# Patient Record
Sex: Male | Born: 1935 | Race: White | Hispanic: No | Marital: Married | Smoking: Former smoker
Health system: Southern US, Community
[De-identification: ages and names within clinical notes are randomized; demographics above are authoritative.]

## PROBLEM LIST (undated history)

## (undated) DIAGNOSIS — C801 Malignant (primary) neoplasm, unspecified: Secondary | ICD-10-CM

## (undated) DIAGNOSIS — R55 Syncope and collapse: Secondary | ICD-10-CM

## (undated) DIAGNOSIS — I350 Nonrheumatic aortic (valve) stenosis: Secondary | ICD-10-CM

## (undated) DIAGNOSIS — I429 Cardiomyopathy, unspecified: Secondary | ICD-10-CM

## (undated) HISTORY — PX: LUNG REMOVAL, PARTIAL: SHX233

## (undated) HISTORY — PX: HEMORROIDECTOMY: SUR656

---

## 2005-11-12 ENCOUNTER — Ambulatory Visit (HOSPITAL_COMMUNITY): Admission: RE | Admit: 2005-11-12 | Discharge: 2005-11-12 | Payer: Self-pay | Admitting: Specialist

## 2005-12-10 ENCOUNTER — Inpatient Hospital Stay (HOSPITAL_COMMUNITY): Admission: RE | Admit: 2005-12-10 | Discharge: 2005-12-15 | Payer: Self-pay | Admitting: Thoracic Surgery

## 2005-12-10 ENCOUNTER — Encounter (INDEPENDENT_AMBULATORY_CARE_PROVIDER_SITE_OTHER): Payer: Self-pay | Admitting: Specialist

## 2005-12-19 ENCOUNTER — Encounter: Admission: RE | Admit: 2005-12-19 | Discharge: 2005-12-19 | Payer: Self-pay | Admitting: Thoracic Surgery

## 2005-12-26 ENCOUNTER — Encounter: Admission: RE | Admit: 2005-12-26 | Discharge: 2005-12-26 | Payer: Self-pay | Admitting: Thoracic Surgery

## 2006-01-09 ENCOUNTER — Encounter: Admission: RE | Admit: 2006-01-09 | Discharge: 2006-01-09 | Payer: Self-pay | Admitting: Thoracic Surgery

## 2006-01-10 ENCOUNTER — Ambulatory Visit: Payer: Self-pay | Admitting: Oncology

## 2006-02-06 ENCOUNTER — Encounter: Admission: RE | Admit: 2006-02-06 | Discharge: 2006-02-06 | Payer: Self-pay | Admitting: Thoracic Surgery

## 2006-04-25 ENCOUNTER — Ambulatory Visit: Payer: Self-pay | Admitting: Oncology

## 2006-06-05 ENCOUNTER — Encounter: Admission: RE | Admit: 2006-06-05 | Discharge: 2006-06-05 | Payer: Self-pay | Admitting: Thoracic Surgery

## 2006-08-22 ENCOUNTER — Ambulatory Visit: Payer: Self-pay | Admitting: Oncology

## 2006-10-23 ENCOUNTER — Encounter: Admission: RE | Admit: 2006-10-23 | Discharge: 2006-10-23 | Payer: Self-pay | Admitting: Thoracic Surgery

## 2006-10-23 ENCOUNTER — Ambulatory Visit: Payer: Self-pay | Admitting: Thoracic Surgery

## 2006-12-13 ENCOUNTER — Ambulatory Visit: Payer: Self-pay | Admitting: Oncology

## 2007-01-06 IMAGING — CR DG CHEST 1V PORT
1 series · 1 of 1 positions shown · non-contrast
Comparison: Earlier the same date.

CLINICAL DATA: Left chest tube removal. 
 PORTABLE CHEST ? 1 VIEW ? 12/14/05 AT 6756 HOURS:

[view not recorded]
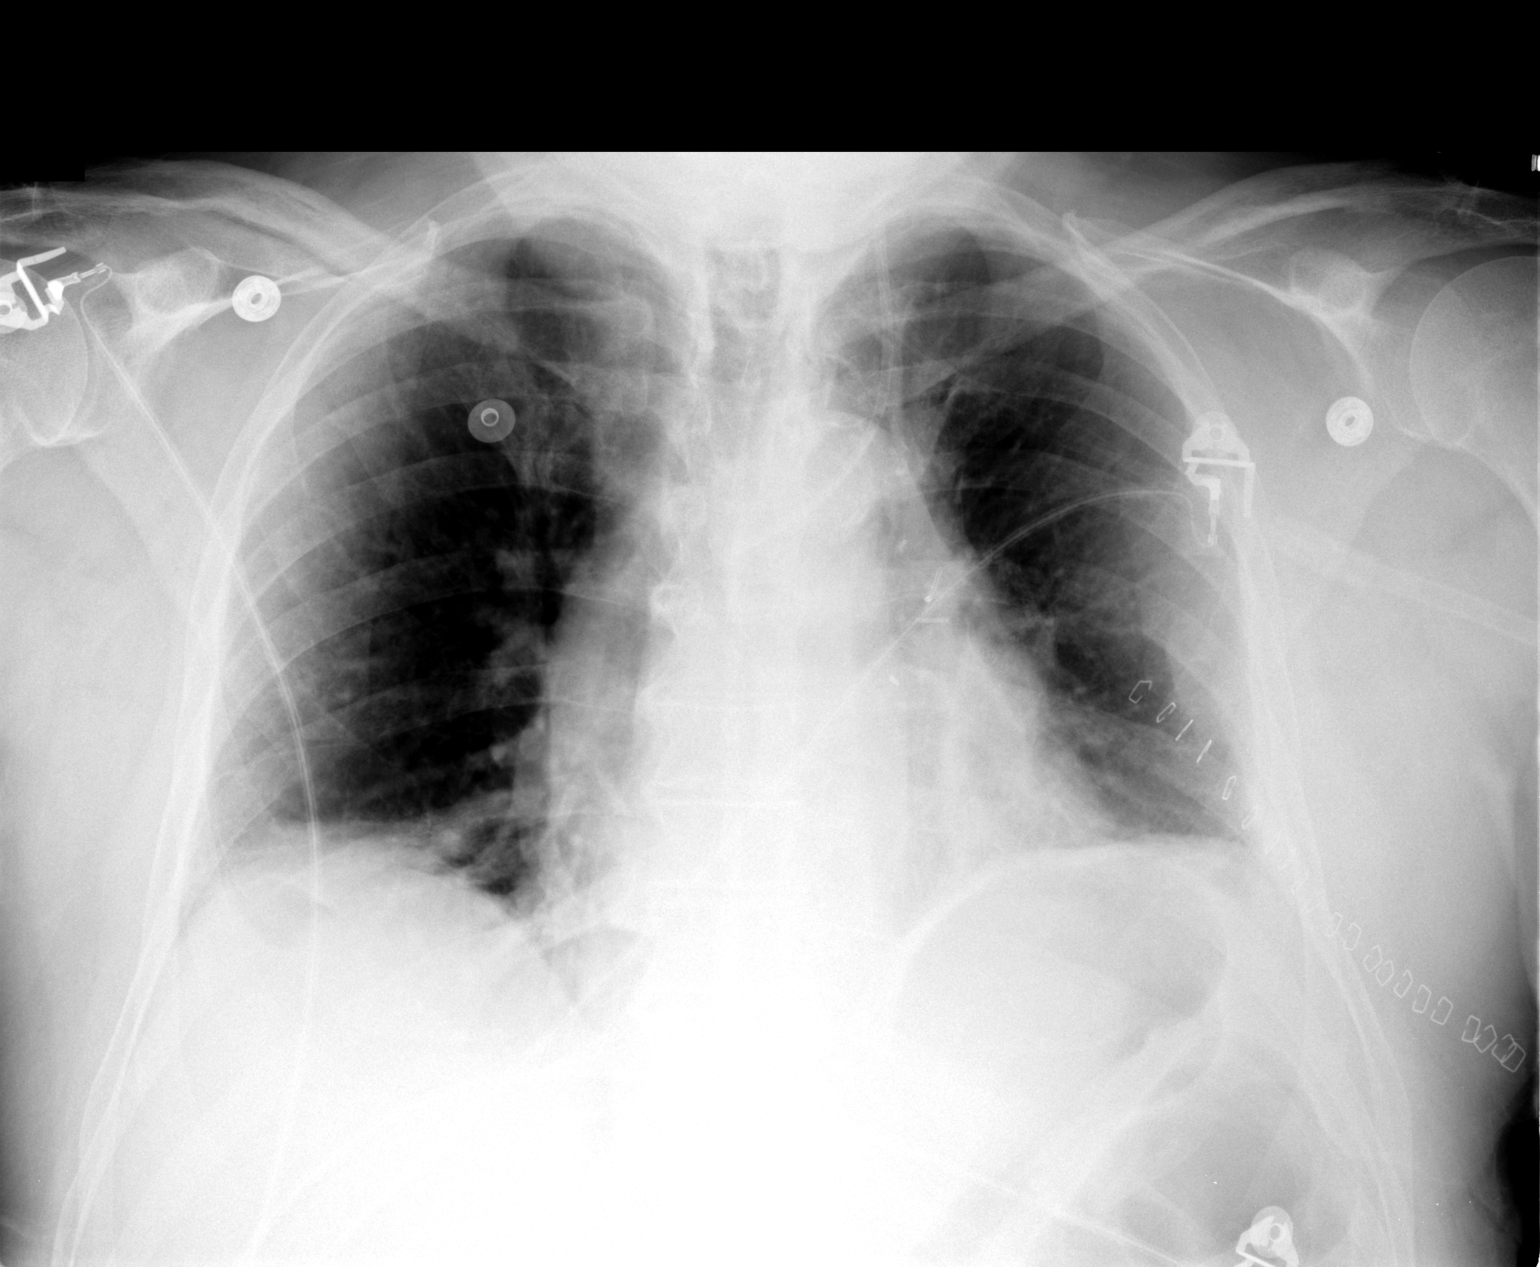

[1 of 1 positions shown; findings below may reference images not displayed]

FINDINGS: The left chest tube has been removed in the interval.  There is no pneumothorax.  The left-sided central line remains in place.  There is mild bibasilar atelectasis.  Cardiomediastinal contours are unchanged.
IMPRESSION: No pneumothorax following left chest tube removal.  Stable bibasilar atelectasis.

## 2007-01-11 IMAGING — CR DG CHEST 2V
2 series · 2 of 2 positions shown · non-contrast
Comparison: Multiple priors, the most recent being 12/15/05.

CLINICAL DATA: Status post left lung surgery on 12/13/05.  Weakness and shortness of breath today.
 CHEST, TWO VIEWS:

[w chest pa]
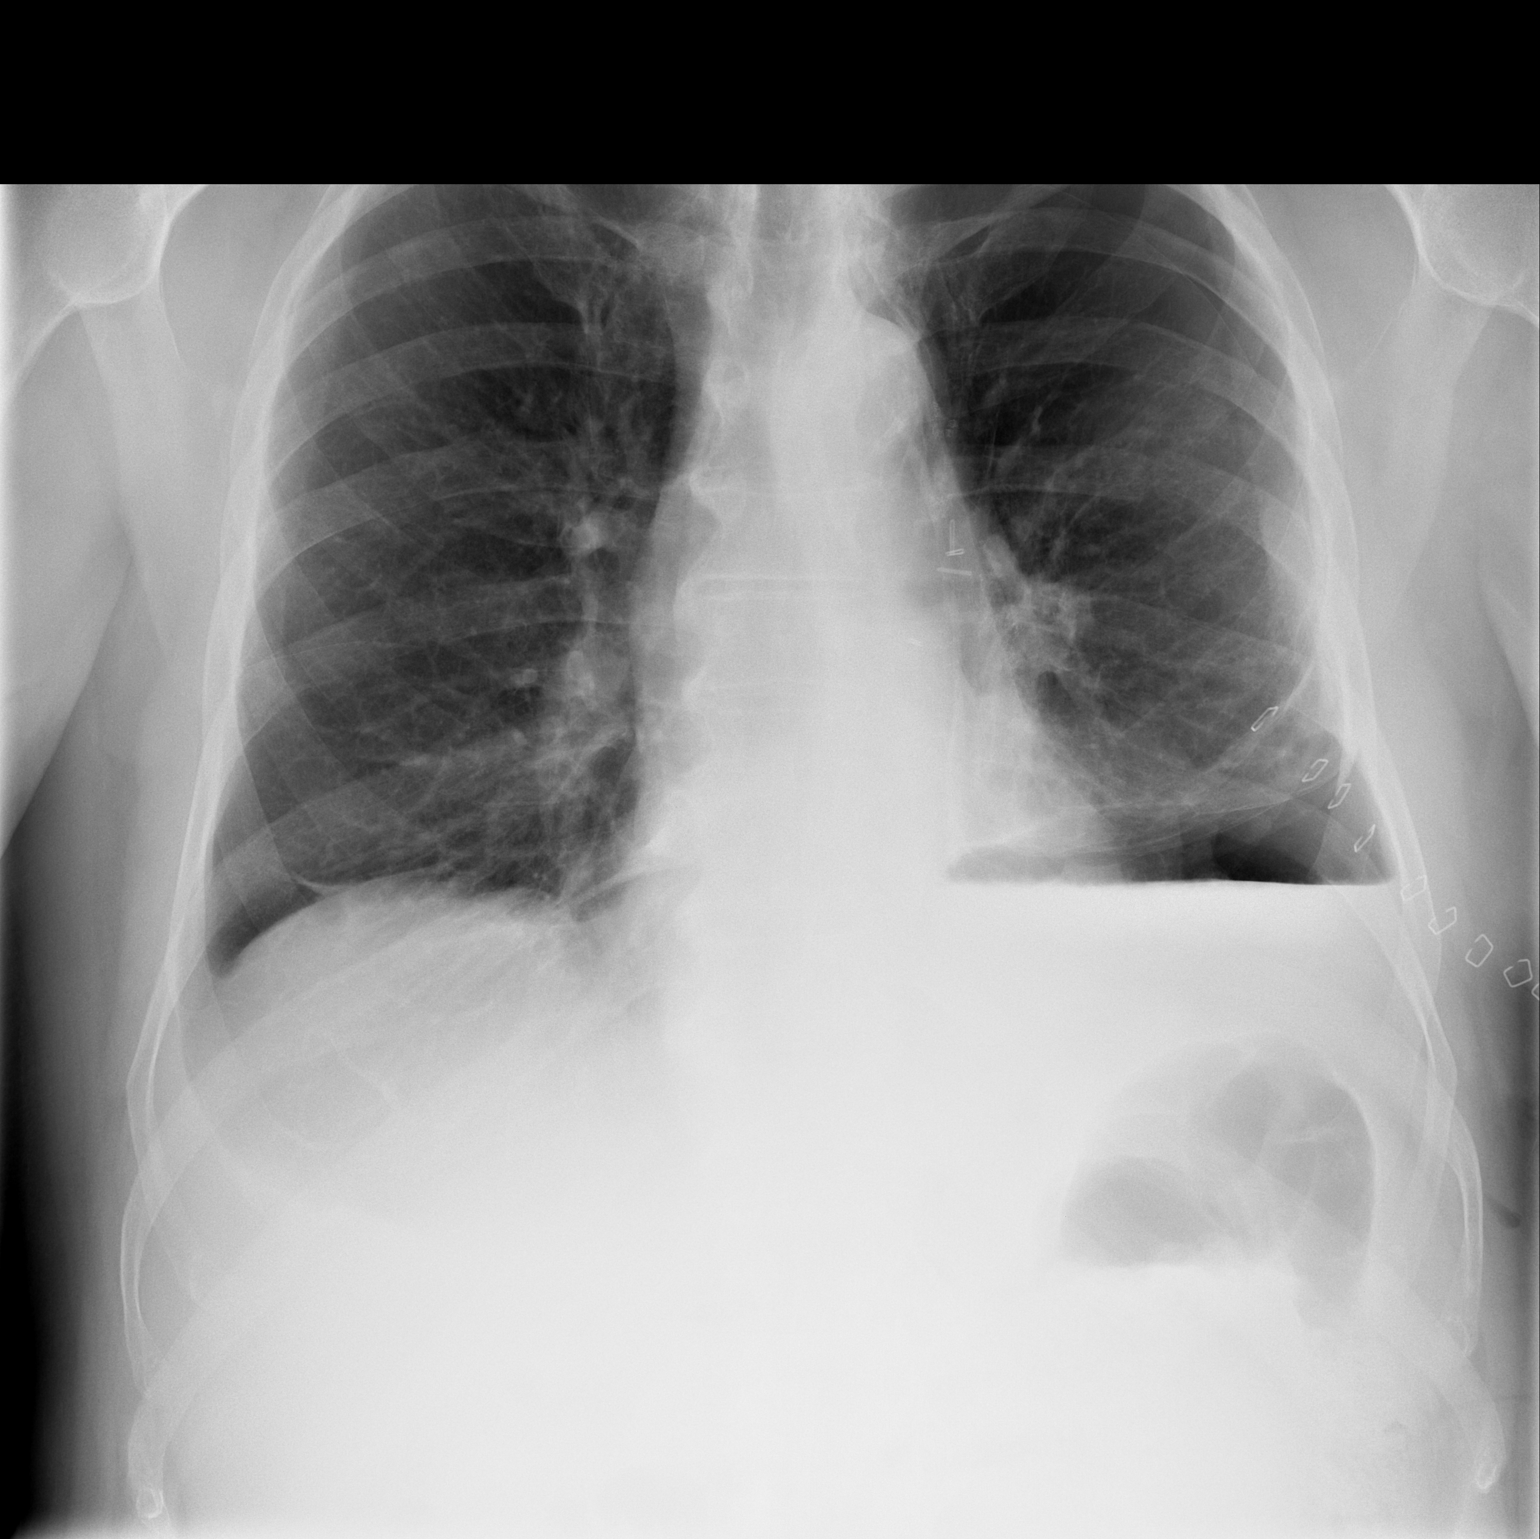

[w chest lat]
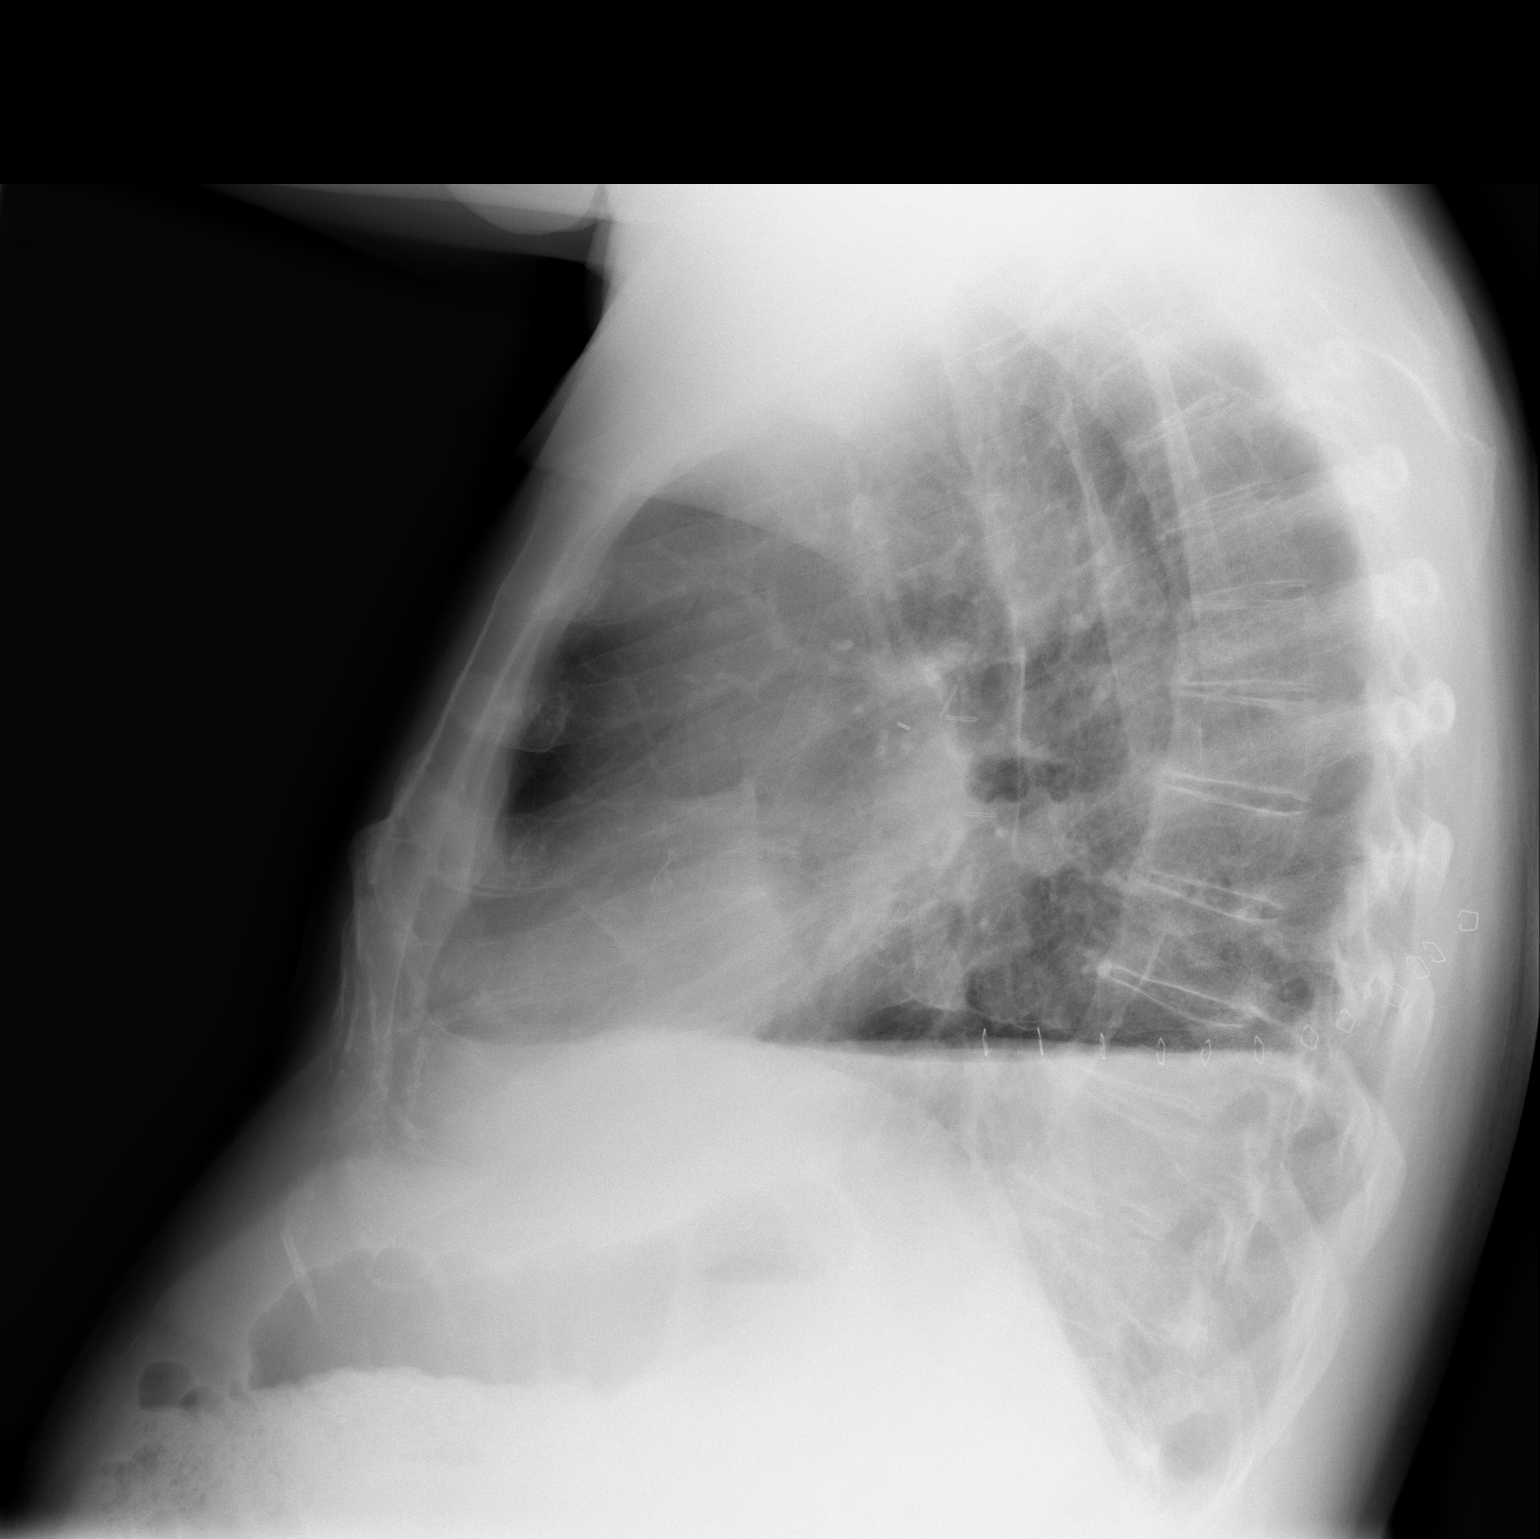

[2 of 2 positions shown; findings below may reference images not displayed]

FINDINGS: There is a new left hydropneumothorax.  Pleural air is moderate in size.  Right base subsegmental atelectasis.
IMPRESSION: New left hydropneumothorax.  Question bronchopleural fistula.

## 2007-04-16 ENCOUNTER — Ambulatory Visit: Payer: Self-pay | Admitting: Thoracic Surgery

## 2007-04-16 ENCOUNTER — Encounter: Admission: RE | Admit: 2007-04-16 | Discharge: 2007-04-16 | Payer: Self-pay | Admitting: Thoracic Surgery

## 2007-10-29 ENCOUNTER — Encounter: Admission: RE | Admit: 2007-10-29 | Discharge: 2007-10-29 | Payer: Self-pay | Admitting: Thoracic Surgery

## 2007-10-29 ENCOUNTER — Ambulatory Visit: Payer: Self-pay | Admitting: Thoracic Surgery

## 2010-09-17 ENCOUNTER — Encounter: Payer: Self-pay | Admitting: Thoracic Surgery

## 2011-01-09 NOTE — Assessment & Plan Note (Signed)
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 He still gets some  shortness of breath with exertion, but is doing well now one year since  his surgery.   His blood pressure is 136/65, pulse 73, respirations 18, sats were 96%.   I will see him back again in six months with a chest x-ray.   Ines Bloomer, M.D.  Electronically Signed   DPB/MEDQ  D:  04/16/2007  T:  04/17/2007  Job:  [id number]

## 2011-01-09 NOTE — Letter (Signed)
October 29, 2007   Weston Settle, MD  [address]  [place], [place] [address]   Re:  MILLER, LIMEHOUSE             DOB:  [birth year]   Dear Dr, Melvyn Neth:   I saw Mr. Nauert back today.  He is now almost two years since we  removed his carcinoid tumor.  He is scheduled for another CT in April.  He is doing well from my standpoint.  His lungs are clear to  auscultation and percussion.  His blood pressure is 125/66, pulse 74,  respirations 18.  Saturations were 95%.  I will be happy to see him  again if he has any future problems.   I appreciate the opportunity of taking care of Mr. Bain.   Ines Bloomer, M.D.  Electronically Signed   DPB/MEDQ  D:  10/29/2007  T:  10/29/2007  Job:  [id number]

## 2011-01-12 NOTE — Discharge Summary (Signed)
[name], [name]             ACCOUNT NO.:  [id number]   MEDICAL RECORD NO.:  [id number]          PATIENT TYPE:  INP   LOCATION:  3305                         FACILITY:  [facility]   PHYSICIAN:  Ines Bloomer, M.D. DATE OF BIRTH:  12/04/1927   DATE OF ADMISSION:  12/10/2005  DATE OF DISCHARGE:  12/15/2005                                 DISCHARGE SUMMARY   ADMISSION DIAGNOSIS:  Left upper lobe mass.   DISCHARGE DIAGNOSES:  1.  Left upper lobe mass, status post left upper lobectomy with node      dissection.  2.  Hypertension.  3.  Gout.  4.  Rectal fissure, repaired.   CONSULTATIONS:  None.   PROCEDURE:  On December 10, 2005, the patient underwent a left VATS, left  thoracotomy, left upper lobectomy with node dissection by Ines Bloomer,  M.D.   HISTORY OF PRESENT ILLNESS:  This is a 77 year old patient who quit smoking  in 1982. Was undergoing workup for back pain and was found to have an L3, L4  and __________ lesion, which was thought to be questionable metastatic  disease. A CT scan of the chest was obtained and showed a right upper lobe  lesion. Subsequently, he underwent a MRI of the lumbar and sacral area and  then a PET scan. The PET scan showed no uptake in the lumbar area as well as  the L3, L4 area, but was positive for left upper lobe lesion, which is  consistent with left upper lobe lung cancer. This lesion is a 1.3 cm nodule.  He has had no fever, chills, sputum, or hemoptysis. His pulmonary function  test showed an FVC of 3.5 and an FEV negative 1 of 2.97 with a diffuse  capacity of 71%. It was best thought that the patient undergo a left  thoracotomy and left upper lobectomy and the patient had agreed to continue  with the procedure.   HOSPITAL COURSE:  Postoperative day 1, the patient had a 1 out of 7 air  leak. Chest x-ray was stable. The patient remained afebrile. IV fluids were  decreased and patient was to get out of bed and ambulate. On  postoperative  day 2, the patient was transferred to room 3305 via routine on telemetry.  The patient's vital signs remained stable and he remained afebrile. The  patient's chest tube had an output of 430 cc on December 10, 2005 and 200 cc  for the last 8 hours on December 11, 2005. The patient's labs were within  normal limits. The patient's chest x-ray showed bibasilar atelectasis. The  patient's posterior chest tube was discontinued. The patient's Foley was  discontinued. The anterior chest tube suction was decreased to 10 cc an  hour.   On postoperative day 3, the patient had a temperature of 100.3. However, his  vitals remained stable. The patient's chest x-ray was stable. His chest tube  had moderate drainage still. The patient's PCA was discontinued. The patient  did have to have his Foley reinserted due to inability to void and Flomax  was started. On postoperative day 4, patient was afebrile.  Vital signs  stable. His anterior chest tube was discontinued and his Foley was  discontinued. His chest x-ray showed improved bibasilar atelectasis.   PHYSICAL EXAMINATION:  VITAL SIGNS:  Stable. Saturations 92% on 2 liters.  GENERAL:  Chest tube on December 14, 2005 __________ had 130 cc output  overnight. Chest x-ray:  Improved bibasilar atelectasis. The patient did  have a bowel movement.  HEART:  Regular rate and rhythm.  LUNGS:  Clear to auscultation.  ABDOMEN:  Moderately distended. No tenderness to palpation. Incision healing  well. Clear, dry, and intact.   DISPOSITION:  The patient will be discharged to home provided that his chest  x-ray is stable in the morning and he is able to void with voiding trial. If  the patient is unable to void, urology will be consulted.   CONDITION ON DISCHARGE:  Good.   DISCHARGE MEDICATIONS:  1.  Lopressor 27 mg p.o. b.i.d.  2.  Tylox 1 to 2 tabs every 4 hours p.r.n.   DIET:  The patient instructed to follow a low-fat, low-salt diet.   ACTIVITY:   No driving or heavy lifting greater than 10 pounds for 3 weeks.  The patient is to walk with assistance and increase activity slowly. They  were instructed to continue the breathing exercises.   WOUND CARE:  The patient may shower and clean wounds with mild soap and  water.   FOLLOW UP:  1.  The patient has followup appointment with Dr. Edwyna Shell on December 19, 2005      at 3:00 p.m.  2.  He will have a chest x-ray taken at 2:30 p.m. at [facility].      Kyle Holster, PA    ______________________________  Ines Bloomer, M.D.    JMW/MEDQ  D:  12/14/2005  T:  12/16/2005  Job:  [id number]   cc:   Ines Bloomer, M.D.  [address]  [place]  [place] [address]

## 2011-01-12 NOTE — Op Note (Signed)
[name], [name]             ACCOUNT NO.:  [id number]   MEDICAL RECORD NO.:  [id number]          PATIENT TYPE:  INP   LOCATION:  2315                         FACILITY:  [facility]   PHYSICIAN:  Ines Bloomer, M.D. DATE OF BIRTH:  1928-04-05   DATE OF PROCEDURE:  12/10/2005  DATE OF DISCHARGE:                                 OPERATIVE REPORT   PREOPERATIVE DIAGNOSIS:  Left upper lobe mass.   POSTOPERATIVE DIAGNOSIS:  Left upper lobe mass, possible neuroendocrine  cancer.   OPERATION/PROCEDURE:  LeftVATS, left thoracotomy, left upper lobectomy with  node dissection.   SURGEON:  Ines Bloomer, M.D.   FIRST ASSISTANT:  __________, PA-C .   ANESTHESIA:  General.   DESCRIPTION OF PROCEDURE:  After adequate general anesthesia, the patient  was turned into left lateral thoracotomy position, prepped and draped in the  usual sterile manner.  Two trocar sites were made in the anterior axillary  line at seventh intercostal space and the mid axillary line at the eighth  intercostal space.  Two trocars were inserted.  There were no interpleural  lesions seen.  Posterolateral thoracotomy was made and divided with  electrocautery.  The subcutaneous tissue and the latissimus of the serratus  was reflected anteriorly.  The fifth intercostal space was entered. A  portion of the sixth rib was taken subperiosteally at the angle.  Two 2-PA's  were placed at right angles and a dissection started superiorly, dissecting  out the pulmonary artery.  There were __________  nodes and 10-L nodes were  resected free and the lesion was palpated in the apical posterior segment of  the left upper lobe.  The superior portion of the tissue was divided with an  easy EZ45 stapler and then the lesion was dissected out with the EZ45  stapler.  Frozen section revealed a neuroendocrine tumor.  The superior  pulmonary vein was dissected out, looped proximally and distally with  vascular tapes.  The apical  posterior branch was dissected out and stapled  with Auto-Suture stapler.  Then the superior pulmonary vein branch was  stapled with Auto-Suture stapler.  We stapled the lingular branches and then  the apical posterior branch.  Several 10-L and one 4-L node were dissected  free from around the superior pulmonary vein.  Several 11-L nodes were  dissected free along the fissure.  The fissure was dissected down to the  lingular branch which was stapled and divided without a suture stapler.  In  the inferior portion the fissure between the ligament and medial basilar  segment of the left lower lobe was divided with EZ45 stapler. __________.  The process was dissected out and stapled with a TL-30 and the left lower  lobe was removed.  We checked for air leaks along the bronchial stump.  There was some leak superiorly and this had to be reinforced with four 4-0  Prolene sutures and CoSeal was applied to this staple line.  Two chest  sutures were brought  out through the trocar site and tied in place with 0 silk.  Chest was closed  with four pericostals, #1  Vicryl to the muscle area, 2-0 Vicryl in the  subcutaneous tissue, and Ethicon skin clips.  The patient returned to the  recovery room in stable condition.           ______________________________  Ines Bloomer, M.D.     DPB/MEDQ  D:  12/10/2005  T:  12/11/2005  Job:  [id number]

## 2011-01-12 NOTE — H&P (Signed)
[name], [name]             ACCOUNT NO.:  [id number]   MEDICAL RECORD NO.:  [id number]          PATIENT TYPE:  INP   LOCATION:  NA                           FACILITY:  [facility]   PHYSICIAN:  Ines Bloomer, M.D. DATE OF BIRTH:  [birth year]   DATE OF ADMISSION:  12/06/2005  DATE OF DISCHARGE:                                HISTORY & PHYSICAL   CHIEF COMPLAINT:  Lung mass.   HISTORY OF PRESENT ILLNESS:  This 77 year old patient quit smoking in 1982,  who was undergoing workup for back pain and was found to have an L3-L4 and  sacral lesion which was thought to be questionable metastatic disease.  A CT  scan of the chest was obtained and showed a right upper lobe lesion.  Subsequently, he underwent an MRI of the lumbar and sacral area and then a  PET scan.  The PET scan showed no uptake in the lumbar area as well as the  L3-L4 area, but was positive with left upper lobe lesion which is consistent  with a left upper lobe lung cancer.  This lesion is 1.3 cm nodule.  He has  had no fever, chills, sputum or hemoptysis.  Pulmonary function test showed  an FVC of 3.59 with an FEV-1 of 2.97 with a diffusion capacity of 71%.   PAST MEDICAL HISTORY:  1.  Gout.  2.  Rectal fissure.   MEDICATIONS:  None.   FAMILY HISTORY:  Positive for coronary artery disease in his father.  There  is no history of cancer.  Alzheimer's disease in mother.   SOCIAL HISTORY:  He is married and has two children.  He is retired from  work.  He quit smoking in 1982.  He drinks one to two beers per day.   REVIEW OF SYSTEMS:  CONSTITUTIONAL:  He has some recent weight gain.  He is  234 pounds and 6 feet 2 inches tall.  CARDIOVASCULAR:  No angina or atrial  fibrillation.  PULMONARY:  See history of present illness.  GASTROINTESTINAL:  Small hiatal hernia.  No nausea, vomiting, constipation  or diarrhea.  GENITOURINARY:  Prostatic hypertrophy on scans, but denies  frequent urination.  VASCULAR:  No claudication,  DVT or TIAs.  NEUROLOGIC:  No headaches, blackouts of seizures.  ORTHOPEDIC:  He has the chronic back  pain as mentioned in the history of present illness.  PSYCHIATRIC:  No  psychiatric illnesses.  HEENT:  Decreased hearing.  No change in his  eyesight.  HEMATOLOGIC:  No problems with anemia.   PHYSICAL EXAMINATION:  GENERAL:  Well-developed, Caucasian male in no acute  distress.  VITAL SIGNS:  Blood pressure 158/80, pulse 88, respirations 18, saturations  96%.  HEENT:  Head is atraumatic.  Pupils equal round and reactive to light and  accommodation.  Extraocular movements intact.  No septal deviation.  Throat  without lesion.  Tympanic membranes are intact, but with decreased hearing  bilaterally.  NECK:  Supple with no thyromegaly.  No supraclavicular axillary adenopathy.  CHEST:  Clear to auscultation and percussion.  HEART:  Regular sinus rhythm, no murmurs.  ABDOMEN:  Soft.  No hepatosplenomegaly.  EXTREMITIES:  Pulses are 2+.  No clubbing or edema.  NEUROLOGIC:  Oriented x3.  Cranial nerves 2-12 grossly intact.  Sensory and  motor intact.  SKIN:  Rosacea of the nose.   IMPRESSION:  1.  Left upper lobe lesion.  2.  Probable nonsmall cell lung cancer.  3.  Gout.  4.  Acne rosacea.   PLAN:  Left thoracotomy and left upper lobectomy.           ______________________________  Ines Bloomer, M.D.     DPB/MEDQ  D:  12/06/2005  T:  12/06/2005  Job:  [id number]

## 2016-03-27 DIAGNOSIS — I429 Cardiomyopathy, unspecified: Secondary | ICD-10-CM

## 2016-03-27 HISTORY — DX: Cardiomyopathy, unspecified: I42.9

## 2016-08-03 ENCOUNTER — Inpatient Hospital Stay (HOSPITAL_COMMUNITY): Payer: Medicare Other | Admitting: Anesthesiology

## 2016-08-03 ENCOUNTER — Emergency Department (HOSPITAL_COMMUNITY): Payer: Medicare Other

## 2016-08-03 ENCOUNTER — Encounter (HOSPITAL_COMMUNITY): Payer: Self-pay | Admitting: Family Medicine

## 2016-08-03 ENCOUNTER — Inpatient Hospital Stay (HOSPITAL_COMMUNITY)
Admission: EM | Admit: 2016-08-03 | Discharge: 2016-08-27 | DRG: 291 | Disposition: E | Payer: Medicare Other | Attending: Pulmonary Disease | Admitting: Pulmonary Disease

## 2016-08-03 DIAGNOSIS — Z85118 Personal history of other malignant neoplasm of bronchus and lung: Secondary | ICD-10-CM

## 2016-08-03 DIAGNOSIS — I350 Nonrheumatic aortic (valve) stenosis: Secondary | ICD-10-CM | POA: Diagnosis present

## 2016-08-03 DIAGNOSIS — Z87891 Personal history of nicotine dependence: Secondary | ICD-10-CM | POA: Diagnosis not present

## 2016-08-03 DIAGNOSIS — M4813 Ankylosing hyperostosis [Forestier], cervicothoracic region: Secondary | ICD-10-CM | POA: Diagnosis present

## 2016-08-03 DIAGNOSIS — D696 Thrombocytopenia, unspecified: Secondary | ICD-10-CM | POA: Diagnosis present

## 2016-08-03 DIAGNOSIS — Y92481 Parking lot as the place of occurrence of the external cause: Secondary | ICD-10-CM | POA: Diagnosis not present

## 2016-08-03 DIAGNOSIS — I455 Other specified heart block: Secondary | ICD-10-CM | POA: Diagnosis present

## 2016-08-03 DIAGNOSIS — I420 Dilated cardiomyopathy: Secondary | ICD-10-CM | POA: Diagnosis present

## 2016-08-03 DIAGNOSIS — I272 Pulmonary hypertension, unspecified: Secondary | ICD-10-CM | POA: Diagnosis present

## 2016-08-03 DIAGNOSIS — T148XXA Other injury of unspecified body region, initial encounter: Secondary | ICD-10-CM

## 2016-08-03 DIAGNOSIS — E669 Obesity, unspecified: Secondary | ICD-10-CM | POA: Diagnosis present

## 2016-08-03 DIAGNOSIS — S22069A Unspecified fracture of T7-T8 vertebra, initial encounter for closed fracture: Secondary | ICD-10-CM | POA: Diagnosis present

## 2016-08-03 DIAGNOSIS — I4901 Ventricular fibrillation: Secondary | ICD-10-CM | POA: Diagnosis not present

## 2016-08-03 DIAGNOSIS — S12500A Unspecified displaced fracture of sixth cervical vertebra, initial encounter for closed fracture: Secondary | ICD-10-CM | POA: Diagnosis present

## 2016-08-03 DIAGNOSIS — G9340 Encephalopathy, unspecified: Secondary | ICD-10-CM | POA: Diagnosis not present

## 2016-08-03 DIAGNOSIS — S060X1A Concussion with loss of consciousness of 30 minutes or less, initial encounter: Secondary | ICD-10-CM | POA: Diagnosis present

## 2016-08-03 DIAGNOSIS — Z820 Family history of epilepsy and other diseases of the nervous system: Secondary | ICD-10-CM

## 2016-08-03 DIAGNOSIS — R55 Syncope and collapse: Secondary | ICD-10-CM | POA: Diagnosis not present

## 2016-08-03 DIAGNOSIS — W1839XA Other fall on same level, initial encounter: Secondary | ICD-10-CM | POA: Diagnosis present

## 2016-08-03 DIAGNOSIS — S0990XA Unspecified injury of head, initial encounter: Secondary | ICD-10-CM

## 2016-08-03 DIAGNOSIS — S129XXA Fracture of neck, unspecified, initial encounter: Secondary | ICD-10-CM

## 2016-08-03 DIAGNOSIS — Z902 Acquired absence of lung [part of]: Secondary | ICD-10-CM | POA: Diagnosis not present

## 2016-08-03 DIAGNOSIS — I5043 Acute on chronic combined systolic (congestive) and diastolic (congestive) heart failure: Secondary | ICD-10-CM | POA: Diagnosis present

## 2016-08-03 DIAGNOSIS — I060 Rheumatic aortic stenosis: Secondary | ICD-10-CM | POA: Diagnosis not present

## 2016-08-03 DIAGNOSIS — J449 Chronic obstructive pulmonary disease, unspecified: Secondary | ICD-10-CM | POA: Diagnosis present

## 2016-08-03 DIAGNOSIS — M2578 Osteophyte, vertebrae: Secondary | ICD-10-CM | POA: Diagnosis present

## 2016-08-03 DIAGNOSIS — I5042 Chronic combined systolic (congestive) and diastolic (congestive) heart failure: Secondary | ICD-10-CM | POA: Diagnosis present

## 2016-08-03 DIAGNOSIS — J9690 Respiratory failure, unspecified, unspecified whether with hypoxia or hypercapnia: Secondary | ICD-10-CM

## 2016-08-03 DIAGNOSIS — I428 Other cardiomyopathies: Secondary | ICD-10-CM

## 2016-08-03 DIAGNOSIS — I469 Cardiac arrest, cause unspecified: Secondary | ICD-10-CM

## 2016-08-03 DIAGNOSIS — M109 Gout, unspecified: Secondary | ICD-10-CM | POA: Diagnosis present

## 2016-08-03 DIAGNOSIS — I509 Heart failure, unspecified: Secondary | ICD-10-CM

## 2016-08-03 DIAGNOSIS — Z79899 Other long term (current) drug therapy: Secondary | ICD-10-CM

## 2016-08-03 DIAGNOSIS — Z6830 Body mass index (BMI) 30.0-30.9, adult: Secondary | ICD-10-CM | POA: Diagnosis not present

## 2016-08-03 DIAGNOSIS — J9601 Acute respiratory failure with hypoxia: Secondary | ICD-10-CM

## 2016-08-03 DIAGNOSIS — Z8249 Family history of ischemic heart disease and other diseases of the circulatory system: Secondary | ICD-10-CM

## 2016-08-03 HISTORY — DX: Syncope and collapse: R55

## 2016-08-03 HISTORY — DX: Malignant (primary) neoplasm, unspecified: C80.1

## 2016-08-03 HISTORY — DX: Nonrheumatic aortic (valve) stenosis: I35.0

## 2016-08-03 HISTORY — DX: Cardiomyopathy, unspecified: I42.9

## 2016-08-03 LAB — I-STAT CHEM 8, ED
BUN: 21 mg/dL — AB (ref 6–20)
CREATININE: 1 mg/dL (ref 0.61–1.24)
Calcium, Ion: 1.09 mmol/L — ABNORMAL LOW (ref 1.15–1.40)
Chloride: 105 mmol/L (ref 101–111)
GLUCOSE: 116 mg/dL — AB (ref 65–99)
HEMATOCRIT: 45 % (ref 39.0–52.0)
Hemoglobin: 15.3 g/dL (ref 13.0–17.0)
Potassium: 3.6 mmol/L (ref 3.5–5.1)
Sodium: 139 mmol/L (ref 135–145)
TCO2: 23 mmol/L (ref 0–100)

## 2016-08-03 LAB — DIFFERENTIAL
BASOS ABS: 0 10*3/uL (ref 0.0–0.1)
Basophils Relative: 0 %
Eosinophils Absolute: 0.1 10*3/uL (ref 0.0–0.7)
Eosinophils Relative: 1 %
LYMPHS ABS: 2.1 10*3/uL (ref 0.7–4.0)
Lymphocytes Relative: 25 %
MONO ABS: 0.8 10*3/uL (ref 0.1–1.0)
MONOS PCT: 9 %
NEUTROS ABS: 5.6 10*3/uL (ref 1.7–7.7)
Neutrophils Relative %: 65 %

## 2016-08-03 LAB — BRAIN NATRIURETIC PEPTIDE: B NATRIURETIC PEPTIDE 5: 1724.3 pg/mL — AB (ref 0.0–100.0)

## 2016-08-03 LAB — COMPREHENSIVE METABOLIC PANEL
ALK PHOS: 54 U/L (ref 38–126)
ALT: 21 U/L (ref 17–63)
AST: 30 U/L (ref 15–41)
Albumin: 4.1 g/dL (ref 3.5–5.0)
Anion gap: 13 (ref 5–15)
BILIRUBIN TOTAL: 0.9 mg/dL (ref 0.3–1.2)
BUN: 16 mg/dL (ref 6–20)
CALCIUM: 9.1 mg/dL (ref 8.9–10.3)
CO2: 20 mmol/L — ABNORMAL LOW (ref 22–32)
CREATININE: 1.15 mg/dL (ref 0.61–1.24)
Chloride: 104 mmol/L (ref 101–111)
GFR calc Af Amer: >60 mL/min (ref >60)
GFR, EST NON AFRICAN AMERICAN: 56 mL/min — AB (ref >60)
Glucose, Bld: 120 mg/dL — ABNORMAL HIGH (ref 65–99)
Potassium: 3.6 mmol/L (ref 3.5–5.1)
Sodium: 137 mmol/L (ref 135–145)
TOTAL PROTEIN: 7.2 g/dL (ref 6.5–8.1)

## 2016-08-03 LAB — CBC
HCT: 43.7 % (ref 39.0–52.0)
HEMOGLOBIN: 14.6 g/dL (ref 13.0–17.0)
MCH: 29.7 pg (ref 26.0–34.0)
MCHC: 33.4 g/dL (ref 30.0–36.0)
MCV: 88.8 fL (ref 78.0–100.0)
Platelets: 142 10*3/uL — ABNORMAL LOW (ref 150–400)
RBC: 4.92 MIL/uL (ref 4.22–5.81)
RDW: 15.6 % — ABNORMAL HIGH (ref 11.5–15.5)
WBC: 8.7 10*3/uL (ref 4.0–10.5)

## 2016-08-03 LAB — PROTIME-INR
INR: 1.29
Prothrombin Time: 16.2 seconds — ABNORMAL HIGH (ref 11.4–15.2)

## 2016-08-03 LAB — I-STAT TROPONIN, ED: TROPONIN I, POC: 0.01 ng/mL (ref 0.00–0.08)

## 2016-08-03 LAB — APTT: APTT: 34 s (ref 24–36)

## 2016-08-03 LAB — CBG MONITORING, ED: Glucose-Capillary: 121 mg/dL — ABNORMAL HIGH (ref 65–99)

## 2016-08-03 LAB — TSH: TSH: 3.071 u[IU]/mL (ref 0.350–4.500)

## 2016-08-03 MED ORDER — FENTANYL CITRATE (PF) 100 MCG/2ML IJ SOLN
INTRAMUSCULAR | Status: AC
  Administered 2016-08-03: 100 ug
  Filled 2016-08-03: qty 2

## 2016-08-03 MED ORDER — SODIUM CHLORIDE 0.9% FLUSH: 3.0000 mL | INTRAVENOUS | Status: DC | PRN

## 2016-08-03 MED ORDER — FUROSEMIDE 10 MG/ML IJ SOLN
40.0000 mg | Freq: Once | INTRAMUSCULAR | Status: AC
  Administered 2016-08-03: 40 mg via INTRAVENOUS
  Filled 2016-08-03: qty 4

## 2016-08-03 MED ORDER — MIDAZOLAM HCL 2 MG/2ML IJ SOLN
INTRAMUSCULAR | Status: AC
  Administered 2016-08-03: 2 mg
  Filled 2016-08-03: qty 2

## 2016-08-03 MED ORDER — IPRATROPIUM-ALBUTEROL 0.5-2.5 (3) MG/3ML IN SOLN
3.0000 mL | Freq: Three times a day (TID) | RESPIRATORY_TRACT | Status: DC
  Administered 2016-08-03: 3 mL via RESPIRATORY_TRACT
  Filled 2016-08-03: qty 3

## 2016-08-03 MED ORDER — FUROSEMIDE 10 MG/ML IJ SOLN
40.0000 mg | Freq: Two times a day (BID) | INTRAMUSCULAR | Status: DC
  Administered 2016-08-04: 40 mg via INTRAVENOUS
  Filled 2016-08-03 (×3): qty 4

## 2016-08-03 MED ORDER — ONDANSETRON HCL 4 MG/2ML IJ SOLN: 4.0000 mg | Freq: Four times a day (QID) | INTRAMUSCULAR | Status: DC | PRN

## 2016-08-03 MED ORDER — IPRATROPIUM-ALBUTEROL 0.5-2.5 (3) MG/3ML IN SOLN
3.0000 mL | Freq: Once | RESPIRATORY_TRACT | Status: AC
  Administered 2016-08-03: 3 mL via RESPIRATORY_TRACT
  Filled 2016-08-03: qty 3

## 2016-08-03 MED ORDER — HYDROCODONE-ACETAMINOPHEN 5-325 MG PO TABS
1.0000 | ORAL_TABLET | Freq: Four times a day (QID) | ORAL | Status: DC | PRN
  Administered 2016-08-03: 2 via ORAL
  Filled 2016-08-03: qty 2

## 2016-08-03 MED ORDER — SODIUM CHLORIDE 0.9 % IV SOLN: 250.0000 mL | INTRAVENOUS | Status: DC | PRN

## 2016-08-03 MED ORDER — FENTANYL CITRATE (PF) 100 MCG/2ML IJ SOLN
50.0000 ug | Freq: Once | INTRAMUSCULAR | Status: AC
  Administered 2016-08-03: 50 ug via INTRAVENOUS
  Filled 2016-08-03: qty 2

## 2016-08-03 MED ORDER — IPRATROPIUM-ALBUTEROL 0.5-2.5 (3) MG/3ML IN SOLN: 3.0000 mL | Freq: Four times a day (QID) | RESPIRATORY_TRACT | Status: DC | PRN

## 2016-08-03 MED ORDER — SODIUM CHLORIDE 0.9% FLUSH
3.0000 mL | Freq: Two times a day (BID) | INTRAVENOUS | Status: DC
  Administered 2016-08-03: 3 mL via INTRAVENOUS
  Administered 2016-08-04: 10 mL via INTRAVENOUS

## 2016-08-03 MED ORDER — ACETAMINOPHEN 325 MG PO TABS: 650.0000 mg | ORAL_TABLET | ORAL | Status: DC | PRN

## 2016-08-03 MED ORDER — ENOXAPARIN SODIUM 40 MG/0.4ML ~~LOC~~ SOLN
40.0000 mg | SUBCUTANEOUS | Status: DC
  Administered 2016-08-03: 40 mg via SUBCUTANEOUS
  Filled 2016-08-03: qty 0.4

## 2016-08-03 NOTE — ED Triage Notes (Signed)
Pt presents via [name] with c/o Code Stroke fall while walking on side walk and hit his head with ~25mins loss of consciousness, upon waking pt was confused. Pt reports patient has hx shortness of breath and syncopal >2 episodes in the last 6 months. Upon arrival pt is alert, oriented to self only. No focal weakness. See Stroke RN notes and NIH.

## 2016-08-03 NOTE — ED Provider Notes (Signed)
MSE was initiated and I personally evaluated the patient and placed orders (if any) at  1:11 PM on August 03, 2016.  The patient appears stable so that the remainder of the MSE may be completed by another provider.  Patient fell while walking outside struck his occiput was treated by EMS with supplemental oxygen and hard cervical collar. He was confused immediately after the fall. Presently patient is alert speech is clear. He is stable for transport to radiology suite   Orlie Dakin, MD 08/02/2016 1311

## 2016-08-03 NOTE — ED Notes (Signed)
Report attempted 

## 2016-08-03 NOTE — Anesthesia Procedure Notes (Signed)
Procedure Name: Intubation Date/Time: 08/02/2016 11:45 PM Performed by: Maude Leriche D Pre-anesthesia Checklist: Patient identified, Emergency Drugs available, Suction available, Patient being monitored and Timeout performed Patient Re-evaluated:Patient Re-evaluated prior to inductionOxygen Delivery Method: Ambu bag Preoxygenation: Pre-oxygenation with 100% oxygen (Cervical collar in situ) Laryngoscope Size: McGraph and 4 Grade View: Grade I Tube type: Subglottic suction tube Tube size: 7.5 mm Number of attempts: 1 Airway Equipment and Method: Stylet (Elective Mcgrath)

## 2016-08-03 NOTE — Consult Note (Signed)
Neurology Consultation Reason for Consult: Fall with AMS- Stroke Rule out Referring Physician: EDP  CC: Fall with AMS  History is obtained from: Patient and Family  HPI: [name] is a 82 y.o. male with PMHx of Chronic Combined CHF, NICM Cardiomyopathy, Obesity, Non-rheumatic Aortic Valve Stenosis, and H/o Lung Cancer s/p left upper lobectomy who presented to the ED after a fall. Patient was eating breakfast with his wife at a restaurant when he went outside. His wife was watching him and noted that he appeared to be short of breath, grabbed onto a railing and started to fall backwards. She attempted to get to him in time, but he feel backwards and hit his head on the ground. After the fall, patient was unconscious for 4-5 minutes. When he awoke, he was confused and altered.   Currently, patient admits to posterior neck pain and is unable to specify a laterality. He denies headache, chest pain, nausea, vomiting, diarrhea, lightheadedness, weakness or numbness. He is unable to recall the events of this morning. He does not remember waking up, going to lunch or falling. The last thing he remembers is waking up in the ambulance.   Per family, patient has been following with his PCP and Cardiology for evaluation of shortness of breath over the last several months. He has had two prior syncopal episodes in the last 6 months.   ROS: A 14 point ROS was performed and is negative except as noted in the HPI.   PMHx: Obesity, Former Tobacco Use, Chronic Combined CHF, H/o Lung cancer s/p lobectomy  Family History: No family history of stroke Father: heart attack Mother: Alzheimer's Disease  Social History: Former Tobacco User- quit 30 years ago, Occasional Alcohol Use, No illicit drugs  Exam: Vitals:   07/29/2016 1327 08/08/2016 1330 08/22/2016 1338  BP:  154/97   Pulse:  114   Resp:  (!) 28   Temp:  97.5 F (36.4 C)   TempSrc:  Oral   SpO2: 96% 94%   Weight:   240 lb 11.9 oz (109.2 kg)   Height:   6\' 2"  (1.88 m)   General: Vital signs reviewed.  Patient is elderly, in no acute distress and cooperative with exam.  Head: Posterior hematoma. Eyes: EOMI, conjunctival injection bilaterally, no scleral icterus. PERRLA Mouth: Small lip laceration with dried blood. Neck: Supple, trachea midline, cervical collar in place. Cardiovascular: RRR, 2/6 systolic ejection murmur Pulmonary/Chest: Upper airway noises. Clear to auscultation bilaterally, no wheezes, rales, or rhonchi. Abdominal: Soft, non-tender, non-distended, BS + Extremities: No lower extremity edema bilaterally, pulses symmetric and intact bilaterally. Skin: Warm, dry and intact. No rashes or erythema. Psychiatric: Normal mood and affect. speech and behavior is normal.   Neuro: Mental Status: Patient is awake, alert, oriented to person, but not place and time. Patient is unable to give a clear and coherent history. No signs of aphasia or neglect Cranial Nerves: II: Visual Fields are full. Pupils are equal, round, and reactive to light.   III,IV, VI: EOMI without ptosis or diploplia.  V: Facial sensation is symmetric to temperature VII: Facial movement is symmetric.  VIII: hearing is intact to voice X: Uvula elevates symmetrically XI: Shoulder shrug is symmetric. XII: tongue is midline without atrophy or fasciculations.  Motor: Tone is normal. Bulk is normal. 5/5 strength was present in all four extremities.  Sensory: Sensation is symmetric to light touch and temperature in the arms and legs. Plantars: Toes are downgoing bilaterally.  Cerebellar: FNF and HKS are  intact bilaterally  I have reviewed labs in epic and the results pertinent to this consultation are: CBC significant for platelet count of 142 BMET within normal limits  I have reviewed the images obtained: CT Head WO Contrast/CT Cervical Spine: No acute intracranial abnormality.  Negative for skull fracture. 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 There is a break in this ossification at the C5-6 disc space which could be due to acute fracture. MRI may be helpful for further evaluation to evaluate for ligamentous injury or occult fracture. No other fracture identified in the cervical spine.  Impression: Fall with Concussion. Presentation not consistent with acute CVA. Evidence of possible cervical fracture or ligamentous injury on CT cervical spine.  Recommendations: 1) Supportive treatment and monitoring for post-concussion syndromes 2) Further work up for fall per primary team 3) Consider MRI WO Contrast Cervical Spine for further evaluation of ligamentous injury versus cervical spine as appropriate  Martyn Malay, DO PGY-3 Internal Medicine Resident Pager # [phone] 08/09/2016 2:08 PM

## 2016-08-03 NOTE — ED Notes (Signed)
Patient transported to MRI 

## 2016-08-03 NOTE — ED Notes (Signed)
Received call from MRI. Stated that pt was turning purple when pt moved to MRI stretcher. Went to MRI and assessed. Pt placed on 3L O2 [place] reading 96%. Pt had audible wheezing.  Pt color improved and pt still amendable and stable to receive MRI. Dr. Eulis Foster made aware.

## 2016-08-03 NOTE — Transfer of Care (Signed)
Immediate Anesthesia Transfer of Care Note  Patient: [name]  Procedure(s) Performed: * No procedures listed *  Patient Location: PACU and Nursing Unit  Anesthesia Type:code blue  Level of Consciousness: unresponsive and pateint uncooperative  Airway & Oxygen Therapy: Patient remains intubated per anesthesia plan and Patient placed on Ventilator (see vital sign flow sheet for setting)  Post-op Assessment: Code Blue  Post vital signs: Reviewed  Last Vitals:  Vitals:   08/06/2016 1945 08/20/2016 2153  BP: 117/72 111/62  Pulse: 86 86  Resp: (!) 29 (!) 22  Temp:  36.4 C    Last Pain:  Vitals:   08/02/2016 2153  TempSrc: Oral  PainSc:          Complications: Code Blue

## 2016-08-03 NOTE — ED Notes (Signed)
No answer from floor transporting pt.

## 2016-08-03 NOTE — H&P (Signed)
History and Physical  [name] [id number] DOB: 28-Sep-1927 DOA: 07/31/2016   PCP: Pcp Not In System    Patient coming from: Home  Chief Complaint: sob, syncope  HPI:  [name] is a 82 y.o. male with medical history of systolic and diastolic CHF, COPD, gouty arthritis presents with one-week history of worsening shortness of breath and a syncopal episode. The patient states that he has had numerous syncopal episodes for the past 6 months. He was referred to cardiology, Dr. Jenne Campus in [place] who performed a workup and determined the patient had dilated cardiomyopathy and mild to moderate aortic stenosis. The patient has been on furosemide 20 mg twice a day for the past 2-3 months in addition to lisinopril. The patient was started on lisinopril back in 04/18/2016 after an echocardiogram showed the patient had an EF of 45-50 percent with mild to moderate aortic stenosis. He was also started on furosemide at that time. His dose of lisinopril was gradually titrated up to 20 mg daily, but the patient began having orthostasis and syncopal episodes. As a result, the patient's dose was subsequently reduced to 2.5 mg on 07/27/2016 with improvement of his dizziness and lethargy.  The patient was getting out of his car to go to a restaurant when he began feeling dizzy. By the time he reached the door of the restaurant the patient had a syncopal episode and fell backward hitting his head. EMS was activated.  Regarding his syncopal episodes, the patient has had 4-5 episodes in the past 6 months which have been preceded by some dizziness and generalized weakness feeling. It is usually associated with him changing positions. However the patient states that he has had episodes of bladder incontinence with the syncopal episodes. There is no aura or chest discomfort. The patient denies any previous history of heart catheterization or stress test.  In the emergency department, chest x-ray  revealed pulmonary edema. The patient was given furosemide 40 mg IV. BMP and CBC were unremarkable except for mild thrombocytopenia of 142,000. EKG showed sinus tachycardia with IRBBB. MRI of the cervical spine revealed an acute fracture of the anterior bridging osteophytes C5-C6. Neurosurgery, Dr. Frederich Cha, was consulted by the ED, and did not feel pt needed any surgical intervention.  An aspen collar was recommended with outpt followup.  Assessment/Plan: Acute on chronic systolic and diastolic CHF -August 0000000 echo--EF 45-50 percent, mild to moderate aortic stenosis -Patient has intolerance to ACE inhibitor -Continue furosemide 40 mg IV twice a day -Daily weights -Strict I's and O's -Echocardiogram -07/27/2016 weight 230 pounds at cardiology office -admission weight 240  Syncope  - likely vasovagal by clinical history although cardiac dysrhythmia cannot be ruled out -Place patient on telemetry -EEG -Orthostatic vital signs  COPD -Patient has > 90-pack-year history of tobacco -Quit smoking 30 years ago -Presently stable on room air  Cervical spine osteophyte fracture -Aspen collar -Pain control -Dr. Frederich Cha did not feel pt needs surgical intervention  Thrombocytopenia -Serum B12 -Suspect a component of hepatic congestion  History lung cancer -s/p LLL lobectomy 10 yr ago         Past Medical History:  Diagnosis Date  . Cancer ([place])    Lung  . Syncope    Past Surgical History:  Procedure Laterality Date  . HEMORROIDECTOMY    . LUNG REMOVAL, PARTIAL Left    Social History:  reports that he quit smoking about 30 years ago. His smoking use included Cigarettes.  He smoked 2.00 packs per day. He has never used smokeless tobacco. He reports that he drinks alcohol. He reports that he does not use drugs.   Family hx reviewed.  Pt cannot remember any major medical illness in family  No Known Allergies   Prior to Admission medications   Not on File     Review of Systems:  Constitutional:  No weight loss, night sweats,  fatigue.  Head&Eyes: No headache.  No vision loss.  No eye pain or scotoma ENT:  No Difficulty swallowing,Tooth/dental problems,Sore throat,  No ear ache, post nasal drip,  Cardio-vascular:  No chest pain, Orthopnea, PND,  dizziness, palpitations  GI:  No  abdominal pain, nausea, vomiting, diarrhea, loss of appetite, hematochezia, melena, heartburn, indigestion, Resp:  No cough. No coughing up of blood .No wheezing.No chest wall deformity  Skin:  no rash or lesions.  GU:  no dysuria, change in color of urine, no urgency or frequency. No flank pain.  Musculoskeletal:  No joint pain or swelling. No decreased range of motion. No back pain. + neck pain Psych:  No change in mood or affect. No depression or anxiety. Neurologic: No headache, no dysesthesia, no focal weakness, no vision loss.   Physical Exam: Vitals:   08/26/2016 1730 07/31/2016 1745 08/08/2016 1800 08/08/2016 1815  BP: 121/82 116/64 118/63 118/73  Pulse: 99 94 95 95  Resp: (!) 35 26 (!) 29 (!) 30  Temp:      TempSrc:      SpO2: 97% 98% 97% 98%  Weight:      Height:       General:  A&O x 3, NAD, nontoxic, pleasant/cooperative Head/Eye: No conjunctival hemorrhage, no icterus, [place]/AT, No nystagmus ENT:  No icterus,  No thrush, good dentition, no pharyngeal exudate Neck:  No masses, no lymphadenpathy, no bruits CV:  RRR, no rub, no gallop, no S3 Lung:  Bilateral crackles, no wheeze Abdomen: soft/NT, +BS, nondistended, no peritoneal signs Ext: No cyanosis, No rashes, No petechiae, No lymphangitis, 1+ LE edema Neuro: CNII-XII intact, strength 4/5 in bilateral upper and lower extremities, no dysmetria  Labs on Admission:  Basic Metabolic Panel:  Recent Labs Lab 08/26/2016 1308 08/21/2016 1315  NA 137 139  K 3.6 3.6  CL 104 105  CO2 20*  --   GLUCOSE 120* 116*  BUN 16 21*  CREATININE 1.15 1.00  CALCIUM 9.1  --    Liver Function  Tests:  Recent Labs Lab 08/07/2016 1308  AST 30  ALT 21  ALKPHOS 54  BILITOT 0.9  PROT 7.2  ALBUMIN 4.1   No results for input(s): LIPASE, AMYLASE in the last 168 hours. No results for input(s): AMMONIA in the last 168 hours. CBC:  Recent Labs Lab 08/13/2016 1308 08/26/2016 1315  WBC 8.7  --   NEUTROABS 5.6  --   HGB 14.6 15.3  HCT 43.7 45.0  MCV 88.8  --   PLT 142*  --    Coagulation Profile:  Recent Labs Lab 07/31/2016 1308  INR 1.29   Cardiac Enzymes: No results for input(s): CKTOTAL, CKMB, CKMBINDEX, TROPONINI in the last 168 hours. BNP: Invalid input(s): POCBNP CBG:  Recent Labs Lab 08/02/2016 1331  GLUCAP 121*   Urine analysis: No results found for: COLORURINE, APPEARANCEUR, LABSPEC, PHURINE, GLUCOSEU, HGBUR, BILIRUBINUR, KETONESUR, PROTEINUR, UROBILINOGEN, NITRITE, LEUKOCYTESUR Sepsis Labs: @LABRCNTIP (procalcitonin:4,lacticidven:4) )No results found for this or any previous visit (from the past 240 hour(s)).   Radiological Exams on Admission: Ct Cervical Spine Wo Contrast  Result Date: 08/20/2016 CLINICAL DATA:  Fall.  Code stroke.  Altered level of consciousness. EXAM: CT HEAD WITHOUT CONTRAST CT CERVICAL SPINE WITHOUT CONTRAST TECHNIQUE: Multidetector CT imaging of the head and cervical spine was performed following the standard protocol without intravenous contrast. Multiplanar CT image reconstructions of the cervical spine were also generated. COMPARISON:  None. FINDINGS: CT HEAD FINDINGS Brain: Moderate atrophy. Negative for acute or chronic infarction. Negative for acute hemorrhage or mass. No shift of the midline structures. Vascular: No hyperdense vessel or unexpected calcification. Skull: Right parietal soft tissue swelling in the scalp. Negative for skull fracture. Sinuses/Orbits: Mild mucosal edema paranasal sinuses.  Normal orbit. Other: None CT CERVICAL SPINE FINDINGS Alignment: Mild retrolisthesis C5-6.  Remaining alignment normal. 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 There is a break in the ossification at the C5-6 disc space which is well-defined and could be acute. MRI may be helpful determine if there is soft tissue or bone marrow edema in this area. No fractures in the posterior elements. No vertebral body fracture. Soft tissues and spinal canal: Negative for soft tissue mass. Extensive calcification in the carotid arteries bilaterally. Disc levels: Mild cervical spine disc degeneration. No significant spinal stenosis. Upper chest: Negative Other: None IMPRESSION: No acute intracranial abnormality. Negative for skull fracture. 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 There is a break in this ossification at the C5-6 disc space which could be due to acute fracture. MRI may be helpful for further evaluation to evaluate for ligamentous injury or occult fracture. No other fracture identified in the cervical spine. These results were called by telephone at the time of interpretation on 07/27/2016 at 1:42 pm to Dr. Orlie Dakin, and Dr. Leonel Ramsay, who verbally acknowledged these results. Electronically Signed   By: Franchot Gallo M.D.   On: 07/29/2016 13:43   Mr Cervical Spine Wo Contrast  Result Date: 08/20/2016 CLINICAL DATA:  Personal history of congestive heart failure and lung cancer. Acute presentation after falling in a restaurant today. EXAM: MRI CERVICAL SPINE WITHOUT CONTRAST TECHNIQUE: Multiplanar, multisequence MR imaging of the cervical spine was performed. No intravenous contrast was administered. COMPARISON:  Cervical spine CT 08/03/2016. Chest CT 12/14/2009 which barely includes the C5-6 level anteriorly. Chest CT from 12/13/2008 which better includes the C5-6 level anteriorly. FINDINGS: Alignment: 2 mm retrolisthesis C5-6.  Vertebrae: Hyperextension injury with acute fracture of a previously intact anterior osteophyte at C5-6. Mild edema in the disc space. Mild prevertebral edema. No other acute or traumatic finding. Marrow space abnormality within the T1 vertebral body that I think is most consistent with hemangioma. CT findings are fairly subtle. Cord: No cord compression or cord injury. Posterior Fossa, vertebral arteries, paraspinal tissues: Negative Disc levels: Ankylosis of the spine from C3 through the upper thoracic region with the described anterior hyper extension fracture at C5-6. IMPRESSION: MR confirms acute hyperextension injury with fracture of an anterior bridging osteophyte at the C5-6 level. Water signal in the disc space. Mild prevertebral edema. 2 mm retrolisthesis. No canal compromise or cord injury. Additional evidence for this being an acute fracture is that the anterior osteophyte was solid on previous chest CTs. Likely benign hemangioma within the T1 vertebral body. Findings on CT are fairly subtle. T1 hyperintensity affecting most of the lesion makes this unlikely to be metastatic tumor. Electronically Signed   By: Nelson Chimes  M.D.   On: 08/18/2016 16:57   Dg Chest Port 1 View  Result Date: 08/22/2016 CLINICAL DATA:  Stroke. EXAM: PORTABLE CHEST 1 VIEW COMPARISON:  12/30/2015. FINDINGS: Surgical clips left chest. Cardiomegaly with diffuse bilateral pulmonary interstitial prominence consistent with congestive heart failure with pulmonary interstitial edema. Small left pleural effusion. No pneumothorax. IMPRESSION: Congestive heart failure with bilateral pulmonary interstitial edema and small left pleural effusion. Electronically Signed   By: Marcello Moores  Register   On: 08/26/2016 15:40   Ct Head Code Stroke W/o Cm  Result Date: 08/19/2016 CLINICAL DATA:  Fall.  Code stroke.  Altered level of consciousness. EXAM: CT HEAD WITHOUT CONTRAST CT CERVICAL SPINE WITHOUT CONTRAST TECHNIQUE: Multidetector CT imaging  of the head and cervical spine was performed following the standard protocol without intravenous contrast. Multiplanar CT image reconstructions of the cervical spine were also generated. COMPARISON:  None. FINDINGS: CT HEAD FINDINGS Brain: Moderate atrophy. Negative for acute or chronic infarction. Negative for acute hemorrhage or mass. No shift of the midline structures. Vascular: No hyperdense vessel or unexpected calcification. Skull: Right parietal soft tissue swelling in the scalp. Negative for skull fracture. Sinuses/Orbits: Mild mucosal edema paranasal sinuses.  Normal orbit. Other: None CT CERVICAL SPINE FINDINGS Alignment: Mild retrolisthesis C5-6.  Remaining alignment normal. 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 There is a break in the ossification at the C5-6 disc space which is well-defined and could be acute. MRI may be helpful determine if there is soft tissue or bone marrow edema in this area. No fractures in the posterior elements. No vertebral body fracture. Soft tissues and spinal canal: Negative for soft tissue mass. Extensive calcification in the carotid arteries bilaterally. Disc levels: Mild cervical spine disc degeneration. No significant spinal stenosis. Upper chest: Negative Other: None IMPRESSION: No acute intracranial abnormality. Negative for skull fracture. 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 There is a break in this ossification at the C5-6 disc space which could be due to acute fracture. MRI may be helpful for further evaluation to evaluate for ligamentous injury or occult fracture. No other fracture identified in the cervical spine. These results were called by telephone at the time of interpretation on 08/25/2016 at 1:42 pm to Dr. Orlie Dakin, and Dr. Leonel Ramsay, who  verbally acknowledged these results. Electronically Signed   By: Franchot Gallo M.D.   On: 08/15/2016 13:43    EKG: Independently reviewed. Sinus rhythm, IRBBB    Time spent:70 minutes Code Status:   FULL Family Communication:  Sons updated at bedside Disposition Plan: expect 3-4 day hospitalization Consults called: none DVT Prophylaxis:  Lovenox  Almedia Cordell, DO  [facility] Pager [phone]  If 7PM-7AM, please contact night-coverage [email or web address] Password [facility] 08/03/2016, 6:46 PM

## 2016-08-03 NOTE — Code Documentation (Signed)
82yo male arriving to [facility] via [place] at [phone].  Patient was with his wife walking outside and fell backward striking his head.  Per EMS wife stated he looked like he was going to pass out before the fall and then was not responsive for approximately 4 minutes post-fall.  Patient with altered mental status and posterior head hematoma post-fall and EMS activated a code stroke.  Patient with h/o lung cancer, hypertension and MI per EMS.  Stroke team at the bedside on patient arrival.  Labs drawn and patient cleared for CT by Dr. Winfred Leeds.  Patient to CT with team.  CT completed.  NIHSS 2, see documentation for details and code stroke times.  Patient with confusion on exam, unable to answer age and month.  Patient reassessed a few minutes later and able to state age but still unable to state month.  Dr. Leonel Ramsay at the bedside.  Patient with likely concussion s/p fall.  No acute stroke treatment at this time.  Code stroke canceled.  Bedside handoff with ED RN Raquel Sarna.

## 2016-08-03 NOTE — ED Provider Notes (Signed)
[facility] DEPT Provider Note   CSN: [id number] Arrival date & time: 07/31/2016  1307   An emergency department physician performed an initial assessment on this suspected stroke patient at [address]).  History   Chief Complaint Chief Complaint  Patient presents with  . Code Stroke  . Fall  . Altered Mental Status    HPI [name] is a 82 y.o. male.  He is here for evaluation of syncope, which occurred after he walked to the doorway of a restaurant, complained of dizziness, then fell backwards striking his head. He had a several minute episode of loss of consciousness. He had another episode of loss of consciousness, 6 days ago. After that episode, he stopped taking a medication which was given for "heart valve problems". The patient had been doing well this morning, prior to the episode of syncope. His family members and he related that he has had syncope associated with shortness of breath, at least twice. He is being followed by cardiologist, at an outlying facility. There's been no recent fever, chills, cough, change in respiratory status, nausea, vomiting or change in bowel and urinary habits. He has had a partial ectomy. He does not have chronic respiratory issues. Patient presented to emergency department today, by EMS, and a code stroke protocol was initiated. He was seen initially by neurology who discussed case with me, signed off and asked me to assume care. Family members state that the patient only takes a fluid pill, Lasix, once a day. And is not on any other medicines, currently. There are no other known modifying factors.  HPI  Past Medical History:  Diagnosis Date  . Cancer ([place])    Lung  . Syncope     Patient Active Problem List   Diagnosis Date Noted  . History of lung cancer 08/18/2016  . Obesity 08/06/2016  . Former tobacco use 08/10/2016  . Chronic combined systolic and diastolic CHF (congestive heart failure) ([place]) 08/11/2016  . Aortic  stenosis 08/18/2016  . NICM (nonischemic cardiomyopathy) ([place]) 07/28/2016  . Concussion with loss of consciousness <= 30 min, initial encounter 08/19/2016  . History of lobectomy of lung 08/13/2016    Past Surgical History:  Procedure Laterality Date  . HEMORROIDECTOMY    . LUNG REMOVAL, PARTIAL Left        Home Medications    Prior to Admission medications   Not on File    Family History No family history on file.  Social History Social History  Substance Use Topics  . Smoking status: Former Smoker    Packs/day: 2.00    Types: Cigarettes    Quit date: 08/27/1985  . Smokeless tobacco: Never Used  . Alcohol use Yes     Comment: occ.     Allergies   Patient has no known allergies.   Review of Systems Review of Systems  All other systems reviewed and are negative.    Physical Exam Updated Vital Signs BP 138/78   Pulse 104   Temp 97.5 F (36.4 C) (Oral)   Resp (!) 30   Ht 6\' 2"  (1.88 m)   Wt 240 lb 11.9 oz (109.2 kg)   SpO2 94%   BMI 30.91 kg/m   Physical Exam  Constitutional: He is oriented to person, place, and time. He appears well-developed and well-nourished. He appears distressed (He is uncomfortable).  HENT:  Head: Normocephalic.  Right Ear: External ear normal.  Left Ear: External ear normal.  Contusion and abrasion mid parietal. Mild associated  bleeding. No associated crepitation  Eyes: EOM are normal. Pupils are equal, round, and reactive to light.  Mild injection of both scleral conjunctiva  Neck: Normal range of motion and phonation normal. Neck supple.  Cardiovascular: Normal rate, regular rhythm and normal heart sounds.   Pulmonary/Chest: Effort normal and breath sounds normal. He has no wheezes. He exhibits no bony tenderness.  Upper airway noise, nonspecific, audible externally. Good air movement bilaterally in both lungs.  Abdominal: Soft. There is no tenderness.  Musculoskeletal: Normal range of motion.  Normal strength in arms  and legs bilaterally. Cervical collar left in place during examination.  Neurological: He is alert and oriented to person, place, and time. No cranial nerve deficit or sensory deficit. He exhibits normal muscle tone. Coordination normal.  No dysarthria and aphasia or nystagmus.  Skin: Skin is warm, dry and intact.  Psychiatric: He has a normal mood and affect. His behavior is normal. Judgment and thought content normal.  Nursing note and vitals reviewed.    ED Treatments / Results  Labs (all labs ordered are listed, but only abnormal results are displayed) Labs Reviewed  PROTIME-INR - Abnormal; Notable for the following:       Result Value   Prothrombin Time 16.2 (*)    All other components within normal limits  CBC - Abnormal; Notable for the following:    RDW 15.6 (*)    Platelets 142 (*)    All other components within normal limits  COMPREHENSIVE METABOLIC PANEL - Abnormal; Notable for the following:    CO2 20 (*)    Glucose, Bld 120 (*)    GFR calc non Af Amer 56 (*)    All other components within normal limits  CBG MONITORING, ED - Abnormal; Notable for the following:    Glucose-Capillary 121 (*)    All other components within normal limits  I-STAT CHEM 8, ED - Abnormal; Notable for the following:    BUN 21 (*)    Glucose, Bld 116 (*)    Calcium, Ion 1.09 (*)    All other components within normal limits  APTT  DIFFERENTIAL  I-STAT TROPOININ, ED    EKG  EKG Interpretation  Date/Time:  Friday August 03 2016 13:33:15 EST Ventricular Rate:  111 PR Interval:    QRS Duration: 106 QT Interval:  389 QTC Calculation: 529 R Axis:   -59 Text Interpretation:  Sinus or ectopic atrial tachycardia Ventricular premature complex Consider left atrial enlargement Incomplete RBBB and LAFB Anteroseptal infarct, old Repol abnrm suggests ischemia, diffuse leads Prolonged QT interval Since last tracing conduction abnormality, prolonned QT and ST abnormality are all new. Confirmed by  Eulis Foster  MD, [place] [phone]) on 08/05/2016 5:14:19 PM       Radiology Ct Cervical Spine Wo Contrast  Result Date: 08/03/2016 CLINICAL DATA:  Fall.  Code stroke.  Altered level of consciousness. EXAM: CT HEAD WITHOUT CONTRAST CT CERVICAL SPINE WITHOUT CONTRAST TECHNIQUE: Multidetector CT imaging of the head and cervical spine was performed following the standard protocol without intravenous contrast. Multiplanar CT image reconstructions of the cervical spine were also generated. COMPARISON:  None. FINDINGS: CT HEAD FINDINGS Brain: Moderate atrophy. Negative for acute or chronic infarction. Negative for acute hemorrhage or mass. No shift of the midline structures. Vascular: No hyperdense vessel or unexpected calcification. Skull: Right parietal soft tissue swelling in the scalp. Negative for skull fracture. Sinuses/Orbits: Mild mucosal edema paranasal sinuses.  Normal orbit. Other: None CT CERVICAL SPINE FINDINGS Alignment: Mild retrolisthesis C5-6.  Remaining alignment normal. 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 There is a break in the ossification at the C5-6 disc space which is well-defined and could be acute. MRI may be helpful determine if there is soft tissue or bone marrow edema in this area. No fractures in the posterior elements. No vertebral body fracture. Soft tissues and spinal canal: Negative for soft tissue mass. Extensive calcification in the carotid arteries bilaterally. Disc levels: Mild cervical spine disc degeneration. No significant spinal stenosis. Upper chest: Negative Other: None IMPRESSION: No acute intracranial abnormality. Negative for skull fracture. 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 There is a break in this ossification at the C5-6 disc space which could be due to acute  fracture. MRI may be helpful for further evaluation to evaluate for ligamentous injury or occult fracture. No other fracture identified in the cervical spine. These results were called by telephone at the time of interpretation on 08/13/2016 at 1:42 pm to Dr. Orlie Dakin, and Dr. Leonel Ramsay, who verbally acknowledged these results. Electronically Signed   By: Franchot Gallo M.D.   On: 08/02/2016 13:43   Mr Cervical Spine Wo Contrast  Result Date: 08/01/2016 CLINICAL DATA:  Personal history of congestive heart failure and lung cancer. Acute presentation after falling in a restaurant today. EXAM: MRI CERVICAL SPINE WITHOUT CONTRAST TECHNIQUE: Multiplanar, multisequence MR imaging of the cervical spine was performed. No intravenous contrast was administered. COMPARISON:  Cervical spine CT 08/14/2016. Chest CT 12/14/2009 which barely includes the C5-6 level anteriorly. Chest CT from 12/13/2008 which better includes the C5-6 level anteriorly. FINDINGS: Alignment: 2 mm retrolisthesis C5-6. Vertebrae: Hyperextension injury with acute fracture of a previously intact anterior osteophyte at C5-6. Mild edema in the disc space. Mild prevertebral edema. No other acute or traumatic finding. Marrow space abnormality within the T1 vertebral body that I think is most consistent with hemangioma. CT findings are fairly subtle. Cord: No cord compression or cord injury. Posterior Fossa, vertebral arteries, paraspinal tissues: Negative Disc levels: Ankylosis of the spine from C3 through the upper thoracic region with the described anterior hyper extension fracture at C5-6. IMPRESSION: MR confirms acute hyperextension injury with fracture of an anterior bridging osteophyte at the C5-6 level. Water signal in the disc space. Mild prevertebral edema. 2 mm retrolisthesis. No canal compromise or cord injury. Additional evidence for this being an acute fracture is that the anterior osteophyte was solid on previous chest CTs. Likely  benign hemangioma within the T1 vertebral body. Findings on CT are fairly subtle. T1 hyperintensity affecting most of the lesion makes this unlikely to be metastatic tumor. Electronically Signed   By: Nelson Chimes M.D.   On: 07/31/2016 16:57   Dg Chest Port 1 View  Result Date: 08/14/2016 CLINICAL DATA:  Stroke. EXAM: PORTABLE CHEST 1 VIEW COMPARISON:  12/30/2015. FINDINGS: Surgical clips left chest. Cardiomegaly with diffuse bilateral pulmonary interstitial prominence consistent with congestive heart failure with pulmonary interstitial edema. Small left pleural effusion. No pneumothorax. IMPRESSION: Congestive heart failure with bilateral pulmonary interstitial edema and small left pleural effusion. Electronically Signed   By: Marcello Moores  Register   On: 08/02/2016 15:40   Ct Head Code Stroke W/o Cm  Result Date: 07/30/2016 CLINICAL DATA:  Fall.  Code stroke.  Altered level of consciousness. EXAM: CT HEAD WITHOUT CONTRAST CT CERVICAL SPINE WITHOUT CONTRAST TECHNIQUE: Multidetector CT imaging of the  head and cervical spine was performed following the standard protocol without intravenous contrast. Multiplanar CT image reconstructions of the cervical spine were also generated. COMPARISON:  None. FINDINGS: CT HEAD FINDINGS Brain: Moderate atrophy. Negative for acute or chronic infarction. Negative for acute hemorrhage or mass. No shift of the midline structures. Vascular: No hyperdense vessel or unexpected calcification. Skull: Right parietal soft tissue swelling in the scalp. Negative for skull fracture. Sinuses/Orbits: Mild mucosal edema paranasal sinuses.  Normal orbit. Other: None CT CERVICAL SPINE FINDINGS Alignment: Mild retrolisthesis C5-6.  Remaining alignment normal. 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 There is a break in the ossification at the C5-6 disc space which is well-defined and could be acute. MRI  may be helpful determine if there is soft tissue or bone marrow edema in this area. No fractures in the posterior elements. No vertebral body fracture. Soft tissues and spinal canal: Negative for soft tissue mass. Extensive calcification in the carotid arteries bilaterally. Disc levels: Mild cervical spine disc degeneration. No significant spinal stenosis. Upper chest: Negative Other: None IMPRESSION: No acute intracranial abnormality. Negative for skull fracture. 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 There is a break in this ossification at the C5-6 disc space which could be due to acute fracture. MRI may be helpful for further evaluation to evaluate for ligamentous injury or occult fracture. No other fracture identified in the cervical spine. These results were called by telephone at the time of interpretation on 08/21/2016 at 1:42 pm to Dr. Orlie Dakin, and Dr. Leonel Ramsay, who verbally acknowledged these results. Electronically Signed   By: Franchot Gallo M.D.   On: 08/23/2016 13:43    Procedures Procedures (including critical care time)  Medications Ordered in ED Medications  ipratropium-albuterol (DUONEB) 0.5-2.5 (3) MG/3ML nebulizer solution 3 mL (not administered)  fentaNYL (SUBLIMAZE) injection 50 mcg (50 mcg Intravenous Given 07/31/2016 1545)     Initial Impression / Assessment and Plan / ED Course  I have reviewed the triage vital signs and the nursing notes.  Pertinent labs & imaging results that were available during my care of the patient were reviewed by me and considered in my medical decision making (see chart for details).  Clinical Course as of Aug 03 1744  Fri Aug 03, 2016  1544 MRI image results discussed with radiologist, Dr. Carlis Abbott, who recommends MRI imaging of the cervical spine to evaluate for both ligamentous injury, and acute fracture, including injury to  the disc.  [EW]  57 C/W acute CHF DG Chest Port 1 View [EW]  [id number] C 5-6 injury- osteophyte fracture MR CERVICAL SPINE WO CONTRAST [EW]  1711 MR CERVICAL SPINE WO CONTRAST [EW]  1712 No CT Head Code Stroke W/O CM [EW]  1712 No acute CVA CT Head Code Stroke W/O CM [EW]  1713 Case discussed with Dr. Newman Pies, neurosurgery, relative to the cervical injury noted on MRI. Images and treatment plan discussed. He recommends outpatient follow-up, and treatment, currently, with cervical collar. Aspen collar, ordered.  [EW]    Clinical Course User Index [EW] Daleen Bo, MD    Medications  ipratropium-albuterol (DUONEB) 0.5-2.5 (3) MG/3ML nebulizer solution 3 mL (not administered)  fentaNYL (SUBLIMAZE) injection 50 mcg (50 mcg Intravenous Given 07/30/2016 1545)    Patient Vitals for the past 24 hrs:  BP Temp Temp src Pulse Resp SpO2 Height Weight  08/21/2016  1530 138/78 - - 104 (!) 30 94 % - -  08/02/2016 1515 124/79 - - 102 (!) 29 94 % - -  07/27/2016 1500 136/84 - - 103 26 95 % - -  08/24/2016 1338 - - - - - - 6\' 2"  (1.88 m) 240 lb 11.9 oz (109.2 kg)  07/28/2016 1330 154/97 97.5 F (36.4 C) Oral 114 (!) 28 94 % - -  07/28/2016 1327 - - - - - 96 % - -    5:07 PM Reevaluation with update and discussion. After initial assessment and treatment, an updated evaluation reveals He is alert and comfortable. Patient was placed on nasal cannula oxygen secondary to a Provost discoloration of his face after treatment with fentanyl. At this time the Oxygen saturation is 96% on nasal cannula oxygen. Patient family are updated on the findings.Daleen Bo L   5:44 PM-Consult complete with hospitalist. Patient case explained and discussed. he agrees to admit patient for further evaluation and treatment. Call ended at 17:48   CRITICAL CARE Performed by: Richarda Blade Total critical care time: 75 minutes Critical care time was exclusive of separately billable procedures and treating other patients. Critical  care was necessary to treat or prevent imminent or life-threatening deterioration. Critical care was time spent personally by me on the following activities: development of treatment plan with patient and/or surrogate as well as nursing, discussions with consultants, evaluation of patient's response to treatment, examination of patient, obtaining history from patient or surrogate, ordering and performing treatments and interventions, ordering and review of laboratory studies, ordering and review of radiographic studies, pulse oximetry and re-evaluation of patient's condition.  Final Clinical Impressions(s) / ED Diagnoses   Final diagnoses:  Ligament tear  Cervical spine fracture (HCC)  Syncope, unspecified syncope type  Injury of head, initial encounter  Acute congestive heart failure, unspecified congestive heart failure type ([place])   Syncope, apparently recurrent, with history of valvular cardiac disease, type unknown. Syncope, aeportedly, associated with dyspnea. This is likely secondary to congestive heart failure. No cardiac echo available and will need to be done. Doubt bronchospasm or asthma as source for dyspnea. Patient presented as code stroke, however, did not ever have focal neurologic abnormality. He has sustained a head injury. According to neurosurgery, the cervical fracture may become unstable, but is not in imminent need for repair or evaluation. Neurosurgery recommends follow-up in the office, expectantly, within the next few weeks. Because of the recurrent syncope, and new diagnosis for congestive heart failure, the patient will require admission. He will need additional testing and treatment for stabilization.  Nursing Notes Reviewed/ Care Coordinated, and agree without changes. Applicable Imaging Reviewed.  Interpretation of Laboratory Data incorporated into ED treatment  Plan: Admit    New Prescriptions New Prescriptions   No medications on file     Daleen Bo,  MD 08/02/2016 1758

## 2016-08-03 NOTE — Progress Notes (Signed)
Subjective/Objective Called by floor that patient was being resuscitated. Appears that patient had become unresponsive : HR in 30's appeared PEA. CPR started and given EPI. Code Team intubated and stabilized patient.  Scheduled Meds: . enoxaparin (LOVENOX) injection  40 mg Subcutaneous Q24H  . [START ON 08-12-2016] furosemide  40 mg Intravenous BID  . sodium chloride flush  3 mL Intravenous Q12H   Continuous Infusions: PRN Meds:sodium chloride, acetaminophen, HYDROcodone-acetaminophen, ipratropium-albuterol, ondansetron (ZOFRAN) IV, sodium chloride flush  Vital signs in last 24 hours: Temp:  [97.5 F (36.4 C)-99.3 F (37.4 C)] 97.6 F (36.4 C) (12/08 2153) Pulse Rate:  [84-114] 86 (12/08 2153) Resp:  [22-35] 22 (12/08 2153) BP: (107-154)/(62-97) 111/62 (12/08 2153) SpO2:  [94 %-98 %] 97 % (12/08 2153) Weight:  [102.9 kg (226 lb 14.4 oz)-109.2 kg (240 lb 11.9 oz)] 102.9 kg (226 lb 14.4 oz) (12/08 2153)  Intake/Output last 3 shifts: No intake/output data recorded. Intake/Output this shift: Total I/O In: 240 [P.O.:240] Out: 300 [Urine:300]  Problem Assessment/Plan  Successful ROSC after Code Blue. Patient transferred to ICU - CCM managing.

## 2016-08-04 ENCOUNTER — Encounter (HOSPITAL_COMMUNITY): Payer: Self-pay | Admitting: Pulmonary Disease

## 2016-08-04 ENCOUNTER — Inpatient Hospital Stay (HOSPITAL_COMMUNITY): Payer: Medicare Other

## 2016-08-04 DIAGNOSIS — I469 Cardiac arrest, cause unspecified: Secondary | ICD-10-CM

## 2016-08-04 DIAGNOSIS — S060X1A Concussion with loss of consciousness of 30 minutes or less, initial encounter: Secondary | ICD-10-CM

## 2016-08-04 DIAGNOSIS — I060 Rheumatic aortic stenosis: Secondary | ICD-10-CM

## 2016-08-04 DIAGNOSIS — Z87891 Personal history of nicotine dependence: Secondary | ICD-10-CM

## 2016-08-04 DIAGNOSIS — I5043 Acute on chronic combined systolic (congestive) and diastolic (congestive) heart failure: Principal | ICD-10-CM

## 2016-08-04 DIAGNOSIS — R55 Syncope and collapse: Secondary | ICD-10-CM

## 2016-08-04 LAB — CBC WITH DIFFERENTIAL/PLATELET
BASOS ABS: 0 10*3/uL (ref 0.0–0.1)
Basophils Relative: 0 %
EOS PCT: 0 %
Eosinophils Absolute: 0 10*3/uL (ref 0.0–0.7)
HEMATOCRIT: 42.7 % (ref 39.0–52.0)
Hemoglobin: 13.6 g/dL (ref 13.0–17.0)
LYMPHS PCT: 15 %
Lymphs Abs: 2.1 10*3/uL (ref 0.7–4.0)
MCH: 29.5 pg (ref 26.0–34.0)
MCHC: 31.9 g/dL (ref 30.0–36.0)
MCV: 92.6 fL (ref 78.0–100.0)
MONO ABS: 1.2 10*3/uL — AB (ref 0.1–1.0)
MONOS PCT: 8 %
Neutro Abs: 10.4 10*3/uL — ABNORMAL HIGH (ref 1.7–7.7)
Neutrophils Relative %: 77 %
PLATELETS: 129 10*3/uL — AB (ref 150–400)
RBC: 4.61 MIL/uL (ref 4.22–5.81)
RDW: 15.8 % — AB (ref 11.5–15.5)
WBC: 13.6 10*3/uL — ABNORMAL HIGH (ref 4.0–10.5)

## 2016-08-04 LAB — BASIC METABOLIC PANEL
ANION GAP: 16 — AB (ref 5–15)
Anion gap: 12 (ref 5–15)
BUN: 20 mg/dL (ref 6–20)
BUN: 25 mg/dL — ABNORMAL HIGH (ref 6–20)
CALCIUM: 8.9 mg/dL (ref 8.9–10.3)
CO2: 17 mmol/L — ABNORMAL LOW (ref 22–32)
CO2: 21 mmol/L — AB (ref 22–32)
CREATININE: 1.33 mg/dL — AB (ref 0.61–1.24)
Calcium: 8.8 mg/dL — ABNORMAL LOW (ref 8.9–10.3)
Chloride: 103 mmol/L (ref 101–111)
Chloride: 104 mmol/L (ref 101–111)
Creatinine, Ser: 1.55 mg/dL — ABNORMAL HIGH (ref 0.61–1.24)
GFR calc Af Amer: 45 mL/min — ABNORMAL LOW (ref >60)
GFR, EST AFRICAN AMERICAN: 54 mL/min — AB (ref >60)
GFR, EST NON AFRICAN AMERICAN: 39 mL/min — AB (ref >60)
GFR, EST NON AFRICAN AMERICAN: 47 mL/min — AB (ref >60)
GLUCOSE: 162 mg/dL — AB (ref 65–99)
GLUCOSE: 215 mg/dL — AB (ref 65–99)
POTASSIUM: 3.8 mmol/L (ref 3.5–5.1)
Potassium: 4.2 mmol/L (ref 3.5–5.1)
SODIUM: 136 mmol/L (ref 135–145)
Sodium: 137 mmol/L (ref 135–145)

## 2016-08-04 LAB — POCT I-STAT 3, ART BLOOD GAS (G3+)
Acid-base deficit: 12 mmol/L — ABNORMAL HIGH (ref 0.0–2.0)
Bicarbonate: 16.5 mmol/L — ABNORMAL LOW (ref 20.0–28.0)
O2 SAT: 87 %
PCO2 ART: 45.7 mmHg (ref 32.0–48.0)
PO2 ART: 66 mmHg — AB (ref 83.0–108.0)
Patient temperature: 98.6
TCO2: 18 mmol/L (ref 0–100)
pH, Arterial: 7.166 — CL (ref 7.350–7.450)

## 2016-08-04 LAB — GLUCOSE, CAPILLARY
GLUCOSE-CAPILLARY: 143 mg/dL — AB (ref 65–99)
Glucose-Capillary: 151 mg/dL — ABNORMAL HIGH (ref 65–99)
Glucose-Capillary: 200 mg/dL — ABNORMAL HIGH (ref 65–99)

## 2016-08-04 LAB — MRSA PCR SCREENING: MRSA by PCR: NEGATIVE

## 2016-08-04 LAB — COMPREHENSIVE METABOLIC PANEL
ALBUMIN: 3.4 g/dL — AB (ref 3.5–5.0)
AST: 201 U/L — ABNORMAL HIGH (ref 15–41)
Alkaline Phosphatase: 44 U/L (ref 38–126)
Anion gap: 20 — ABNORMAL HIGH (ref 5–15)
BILIRUBIN TOTAL: 1 mg/dL (ref 0.3–1.2)
BUN: 27 mg/dL — AB (ref 6–20)
CHLORIDE: 103 mmol/L (ref 101–111)
CO2: 17 mmol/L — ABNORMAL LOW (ref 22–32)
CREATININE: 1.74 mg/dL — AB (ref 0.61–1.24)
Calcium: 8.3 mg/dL — ABNORMAL LOW (ref 8.9–10.3)
GFR calc Af Amer: 39 mL/min — ABNORMAL LOW (ref >60)
GFR calc non Af Amer: 34 mL/min — ABNORMAL LOW (ref >60)
GLUCOSE: 267 mg/dL — AB (ref 65–99)
Potassium: 3.7 mmol/L (ref 3.5–5.1)
Sodium: 140 mmol/L (ref 135–145)
Total Protein: 6 g/dL — ABNORMAL LOW (ref 6.5–8.1)

## 2016-08-04 LAB — CBC
HEMATOCRIT: 45.2 % (ref 39.0–52.0)
HEMOGLOBIN: 14.9 g/dL (ref 13.0–17.0)
MCH: 29.6 pg (ref 26.0–34.0)
MCHC: 33 g/dL (ref 30.0–36.0)
MCV: 89.9 fL (ref 78.0–100.0)
Platelets: 159 10*3/uL (ref 150–400)
RBC: 5.03 MIL/uL (ref 4.22–5.81)
RDW: 15.6 % — ABNORMAL HIGH (ref 11.5–15.5)
WBC: 15.4 10*3/uL — AB (ref 4.0–10.5)

## 2016-08-04 LAB — PROTIME-INR
INR: 1.82
Prothrombin Time: 21.3 seconds — ABNORMAL HIGH (ref 11.4–15.2)

## 2016-08-04 LAB — PHOSPHORUS: Phosphorus: 6.4 mg/dL — ABNORMAL HIGH (ref 2.5–4.6)

## 2016-08-04 LAB — APTT: aPTT: 57 seconds — ABNORMAL HIGH (ref 24–36)

## 2016-08-04 LAB — TROPONIN I
TROPONIN I: 10.67 ng/mL — AB (ref <0.03)
Troponin I: 25.04 ng/mL (ref <0.03)
Troponin I: 21.77 ng/mL (ref <0.03)

## 2016-08-04 LAB — VITAMIN B12: VITAMIN B 12: 342 pg/mL (ref 180–914)

## 2016-08-04 LAB — MAGNESIUM: MAGNESIUM: 2.7 mg/dL — AB (ref 1.7–2.4)

## 2016-08-04 LAB — LACTIC ACID, PLASMA: Lactic Acid, Venous: 10.7 mmol/L (ref 0.5–1.9)

## 2016-08-04 MED ORDER — FENTANYL CITRATE (PF) 100 MCG/2ML IJ SOLN
100.0000 ug | INTRAMUSCULAR | Status: DC | PRN
  Filled 2016-08-04: qty 2

## 2016-08-04 MED ORDER — ORAL CARE MOUTH RINSE: 15.0000 mL | Freq: Four times a day (QID) | OROMUCOSAL | Status: DC

## 2016-08-04 MED ORDER — MIDAZOLAM HCL 2 MG/2ML IJ SOLN: 2.0000 mg | INTRAMUSCULAR | Status: DC | PRN

## 2016-08-04 MED ORDER — FENTANYL CITRATE (PF) 100 MCG/2ML IJ SOLN
100.0000 ug | INTRAMUSCULAR | Status: DC | PRN
  Administered 2016-08-04: 100 ug via INTRAVENOUS

## 2016-08-04 MED ORDER — FENTANYL CITRATE (PF) 100 MCG/2ML IJ SOLN
25.0000 ug | INTRAMUSCULAR | Status: DC | PRN
  Filled 2016-08-04: qty 2

## 2016-08-04 MED ORDER — IOPAMIDOL (ISOVUE-370) INJECTION 76%
INTRAVENOUS | Status: AC
  Administered 2016-08-04: 80 mL
  Filled 2016-08-04: qty 100

## 2016-08-04 MED ORDER — HEPARIN SODIUM (PORCINE) 5000 UNIT/ML IJ SOLN
5000.0000 [IU] | Freq: Three times a day (TID) | INTRAMUSCULAR | Status: DC
  Filled 2016-08-04 (×2): qty 1

## 2016-08-04 MED ORDER — MIDAZOLAM HCL 2 MG/2ML IJ SOLN
INTRAMUSCULAR | Status: AC
  Filled 2016-08-04: qty 2

## 2016-08-04 MED ORDER — INSULIN ASPART 100 UNIT/ML ~~LOC~~ SOLN
0.0000 [IU] | SUBCUTANEOUS | Status: DC
  Administered 2016-08-04: 1 [IU] via SUBCUTANEOUS

## 2016-08-04 MED ORDER — DOCUSATE SODIUM 50 MG/5ML PO LIQD: 100.0000 mg | Freq: Two times a day (BID) | ORAL | Status: DC | PRN

## 2016-08-04 MED ORDER — ATROPINE SULFATE 1 MG/10ML IJ SOSY
PREFILLED_SYRINGE | INTRAMUSCULAR | Status: AC
  Filled 2016-08-04: qty 10

## 2016-08-04 MED ORDER — CHLORHEXIDINE GLUCONATE 0.12% ORAL RINSE (MEDLINE KIT): 15.0000 mL | Freq: Two times a day (BID) | OROMUCOSAL | Status: DC

## 2016-08-04 MED ORDER — FAMOTIDINE IN NACL 20-0.9 MG/50ML-% IV SOLN
20.0000 mg | Freq: Two times a day (BID) | INTRAVENOUS | Status: DC
  Administered 2016-08-04 (×2): 20 mg via INTRAVENOUS
  Filled 2016-08-04 (×3): qty 50

## 2016-08-04 MED ORDER — FENTANYL CITRATE (PF) 100 MCG/2ML IJ SOLN
INTRAMUSCULAR | Status: AC
  Filled 2016-08-04: qty 2

## 2016-08-04 MED ORDER — PHENYLEPHRINE HCL 10 MG/ML IJ SOLN
0.0000 ug/min | INTRAMUSCULAR | Status: DC
  Filled 2016-08-04: qty 1

## 2016-08-04 MED ORDER — MIDAZOLAM HCL 2 MG/2ML IJ SOLN
2.0000 mg | INTRAMUSCULAR | Status: DC | PRN
  Administered 2016-08-04: 2 mg via INTRAVENOUS
  Filled 2016-08-04: qty 2

## 2016-08-04 MED ORDER — EPINEPHRINE PF 1 MG/ML IJ SOLN
0.5000 ug/min | INTRAMUSCULAR | Status: DC
  Filled 2016-08-04: qty 4

## 2016-08-04 MED FILL — Medication: Qty: 1 | Status: AC

## 2016-08-05 LAB — HEMOGLOBIN A1C
Hgb A1c MFr Bld: 6.2 % — ABNORMAL HIGH (ref 4.8–5.6)
Mean Plasma Glucose: 131 mg/dL

## 2016-08-06 MED FILL — Medication: Qty: 1 | Status: AC

## 2016-08-10 ENCOUNTER — Telehealth: Payer: Self-pay

## 2016-08-10 NOTE — Telephone Encounter (Signed)
On 08/10/2016 I received a death certificate from [facility] (original). The death certificate is for burial. The patient is a patient of Doctor IT consultant. The death certificate will be taken to [facility] ICU on Monday (08/18/2016) for signature.  On 08-18-2016 I received the death certificate back from [place]. I got the death certificate ready and Margaretmary Lombard with the [facility] Department is going to come and pick it up in the am.

## 2016-08-27 NOTE — Consult Note (Signed)
Reason for Consult: C5-6 and T7-8 fracture Referring Physician: Dr. Manfred Shirts is an 83 y.o. male.  HPI: The patient is an 83 year old white male who had a syncopal episode yesterday. He was brought to the ER and worked up with a CT scan and MRI scan of his neck which demonstrated a C5-6 fracture. I was counseled over the phone and recommended he be placed in a cervical orthosis. The patient was admitted for observation and further workup included a CT angiogram of his chest to rule out pulmonary embolism. This demonstrated a T7-8 fracture. A neurosurgical consultation was requested by Dr. Ashok Cordia.  The patient is presently covered by his wife. He is wearing a cervical collar. He is in no apparent distress. He complains of cervical and midthoracic pain. He is not having any numbness, tingling, weakness, etc. He denies headaches.  Past Medical History:  Diagnosis Date  . Aortic stenosis    moderate on 03/2016 TTE  . Cancer ([place])    Lung  . Cardiomyopathy ([place]) 03/2016   LVEF 45-50%  . Syncope     Past Surgical History:  Procedure Laterality Date  . HEMORROIDECTOMY    . LUNG REMOVAL, PARTIAL Left     No family history on file.  Social History:  reports that he quit smoking about 30 years ago. His smoking use included Cigarettes. He smoked 2.00 packs per day. He has never used smokeless tobacco. He reports that he drinks alcohol. He reports that he does not use drugs.  Allergies: No Known Allergies  Medications:  I have reviewed the patient's current medications. Prior to Admission:  No prescriptions prior to admission.   Scheduled: . atropine      . famotidine (PEPCID) IV  20 mg Intravenous Q12H  . furosemide  40 mg Intravenous BID  . heparin subcutaneous  5,000 Units Subcutaneous Q8H  . insulin aspart  0-9 Units Subcutaneous Q4H  . sodium chloride flush  3 mL Intravenous Q12H   Continuous:  NTZ:GYFVCB chloride, acetaminophen, docusate, fentaNYL (SUBLIMAZE)  injection, HYDROcodone-acetaminophen, ipratropium-albuterol, ondansetron (ZOFRAN) IV, sodium chloride flush Anti-infectives    None       Results for orders placed or performed during the hospital encounter of 08/22/2016 (from the past 48 hour(s))  Protime-INR     Status: Abnormal   Collection Time: 08/09/2016  1:08 PM  Result Value Ref Range   Prothrombin Time 16.2 (H) 11.4 - 15.2 seconds   INR 1.29   APTT     Status: None   Collection Time: 08/16/2016  1:08 PM  Result Value Ref Range   aPTT 34 24 - 36 seconds  CBC     Status: Abnormal   Collection Time: 08/22/2016  1:08 PM  Result Value Ref Range   WBC 8.7 4.0 - 10.5 K/uL   RBC 4.92 4.22 - 5.81 MIL/uL   Hemoglobin 14.6 13.0 - 17.0 g/dL   HCT 43.7 39.0 - 52.0 %   MCV 88.8 78.0 - 100.0 fL   MCH 29.7 26.0 - 34.0 pg   MCHC 33.4 30.0 - 36.0 g/dL   RDW 15.6 (H) 11.5 - 15.5 %   Platelets 142 (L) 150 - 400 K/uL  Differential     Status: None   Collection Time: 07/30/2016  1:08 PM  Result Value Ref Range   Neutrophils Relative % 65 %   Neutro Abs 5.6 1.7 - 7.7 K/uL   Lymphocytes Relative 25 %   Lymphs Abs 2.1 0.7 - 4.0 K/uL  Monocytes Relative 9 %   Monocytes Absolute 0.8 0.1 - 1.0 K/uL   Eosinophils Relative 1 %   Eosinophils Absolute 0.1 0.0 - 0.7 K/uL   Basophils Relative 0 %   Basophils Absolute 0.0 0.0 - 0.1 K/uL  Comprehensive metabolic panel     Status: Abnormal   Collection Time: 07/28/2016  1:08 PM  Result Value Ref Range   Sodium 137 135 - 145 mmol/L   Potassium 3.6 3.5 - 5.1 mmol/L   Chloride 104 101 - 111 mmol/L   CO2 20 (L) 22 - 32 mmol/L   Glucose, Bld 120 (H) 65 - 99 mg/dL   BUN 16 6 - 20 mg/dL   Creatinine, Ser 1.15 0.61 - 1.24 mg/dL   Calcium 9.1 8.9 - 10.3 mg/dL   Total Protein 7.2 6.5 - 8.1 g/dL   Albumin 4.1 3.5 - 5.0 g/dL   AST 30 15 - 41 U/L   ALT 21 17 - 63 U/L   Alkaline Phosphatase 54 38 - 126 U/L   Total Bilirubin 0.9 0.3 - 1.2 mg/dL   GFR calc non Af Amer 56 (L) >60 mL/min   GFR calc Af Amer >60  >60 mL/min    Comment: (NOTE) The eGFR has been calculated using the CKD EPI equation. This calculation has not been validated in all clinical situations. eGFR's persistently <60 mL/min signify possible Chronic Kidney Disease.    Anion gap 13 5 - 15  I-stat troponin, ED     Status: None   Collection Time: 08/08/2016  1:14 PM  Result Value Ref Range   Troponin i, poc 0.01 0.00 - 0.08 ng/mL   Comment 3            Comment: Due to the release kinetics of cTnI, a negative result within the first hours of the onset of symptoms does not rule out myocardial infarction with certainty. If myocardial infarction is still suspected, repeat the test at appropriate intervals.   I-Stat Chem 8, ED     Status: Abnormal   Collection Time: 08/08/2016  1:15 PM  Result Value Ref Range   Sodium 139 135 - 145 mmol/L   Potassium 3.6 3.5 - 5.1 mmol/L   Chloride 105 101 - 111 mmol/L   BUN 21 (H) 6 - 20 mg/dL   Creatinine, Ser 1.00 0.61 - 1.24 mg/dL   Glucose, Bld 116 (H) 65 - 99 mg/dL   Calcium, Ion 1.09 (L) 1.15 - 1.40 mmol/L   TCO2 23 0 - 100 mmol/L   Hemoglobin 15.3 13.0 - 17.0 g/dL   HCT 45.0 39.0 - 52.0 %  CBG monitoring, ED     Status: Abnormal   Collection Time: 08/23/2016  1:31 PM  Result Value Ref Range   Glucose-Capillary 121 (H) 65 - 99 mg/dL  Brain natriuretic peptide     Status: Abnormal   Collection Time: 08/08/2016  9:04 PM  Result Value Ref Range   B Natriuretic Peptide 1,724.3 (H) 0.0 - 100.0 pg/mL  TSH     Status: None   Collection Time: 08/14/2016  9:04 PM  Result Value Ref Range   TSH 3.071 0.350 - 4.500 uIU/mL    Comment: Performed by a 3rd Generation assay with a functional sensitivity of <=0.01 uIU/mL.  Glucose, capillary     Status: Abnormal   Collection Time: 2016/08/24 12:09 AM  Result Value Ref Range   Glucose-Capillary 200 (H) 65 - 99 mg/dL   Comment 1 Notify RN    Comment  2 Document in Chart   Basic metabolic panel     Status: Abnormal   Collection Time: 2016-08-30 12:15 AM   Result Value Ref Range   Sodium 136 135 - 145 mmol/L   Potassium 3.8 3.5 - 5.1 mmol/L   Chloride 103 101 - 111 mmol/L   CO2 17 (L) 22 - 32 mmol/L   Glucose, Bld 215 (H) 65 - 99 mg/dL   BUN 20 6 - 20 mg/dL   Creatinine, Ser 1.55 (H) 0.61 - 1.24 mg/dL   Calcium 8.8 (L) 8.9 - 10.3 mg/dL   GFR calc non Af Amer 39 (L) >60 mL/min   GFR calc Af Amer 45 (L) >60 mL/min    Comment: (NOTE) The eGFR has been calculated using the CKD EPI equation. This calculation has not been validated in all clinical situations. eGFR's persistently <60 mL/min signify possible Chronic Kidney Disease.    Anion gap 16 (H) 5 - 15  CBC     Status: Abnormal   Collection Time: 30-Aug-2016 12:15 AM  Result Value Ref Range   WBC 15.4 (H) 4.0 - 10.5 K/uL   RBC 5.03 4.22 - 5.81 MIL/uL   Hemoglobin 14.9 13.0 - 17.0 g/dL   HCT 45.2 39.0 - 52.0 %   MCV 89.9 78.0 - 100.0 fL   MCH 29.6 26.0 - 34.0 pg   MCHC 33.0 30.0 - 36.0 g/dL   RDW 15.6 (H) 11.5 - 15.5 %   Platelets 159 150 - 400 K/uL  MRSA PCR Screening     Status: None   Collection Time: 30-Aug-2016 12:15 AM  Result Value Ref Range   MRSA by PCR NEGATIVE NEGATIVE    Comment:        The GeneXpert MRSA Assay (FDA approved for NASAL specimens only), is one component of a comprehensive MRSA colonization surveillance program. It is not intended to diagnose MRSA infection nor to guide or monitor treatment for MRSA infections.   Troponin I     Status: Abnormal   Collection Time: August 30, 2016 12:41 AM  Result Value Ref Range   Troponin I 10.67 (HH) <0.03 ng/mL    Comment: CRITICAL RESULT CALLED TO, READ BACK BY AND VERIFIED WITH: HANCOCK M,RN 08/30/2016 0133 WAYK   Basic metabolic panel     Status: Abnormal   Collection Time: 2016/08/30  9:39 AM  Result Value Ref Range   Sodium 137 135 - 145 mmol/L   Potassium 4.2 3.5 - 5.1 mmol/L   Chloride 104 101 - 111 mmol/L   CO2 21 (L) 22 - 32 mmol/L   Glucose, Bld 162 (H) 65 - 99 mg/dL   BUN 25 (H) 6 - 20 mg/dL    Creatinine, Ser 1.33 (H) 0.61 - 1.24 mg/dL   Calcium 8.9 8.9 - 10.3 mg/dL   GFR calc non Af Amer 47 (L) >60 mL/min   GFR calc Af Amer 54 (L) >60 mL/min    Comment: (NOTE) The eGFR has been calculated using the CKD EPI equation. This calculation has not been validated in all clinical situations. eGFR's persistently <60 mL/min signify possible Chronic Kidney Disease.    Anion gap 12 5 - 15    Ct Angio Chest Pe W Or Wo Contrast  Result Date: 2016/08/30 CLINICAL DATA:  Syncope. EXAM: CT ANGIOGRAPHY CHEST WITH CONTRAST TECHNIQUE: Multidetector CT imaging of the chest was performed using the standard protocol during bolus administration of intravenous contrast. Multiplanar CT image reconstructions and MIPs were obtained to evaluate the vascular anatomy. CONTRAST:  100 cc Isovue 370 IV COMPARISON:  Radiograph earlier this day. FINDINGS: Cardiovascular: There are no filling defects within the pulmonary arteries to suggest pulmonary embolus. Atherosclerosis of thoracic aorta without aneurysm or evidence of dissection. There is multi chamber cardiomegaly. Coronary artery calcifications. No pericardial effusion. Mediastinum/Nodes: No mediastinal or hilar adenopathy. No axillary adenopathy. Endotracheal tube at the thoracic inlet. The esophagus is decompressed. Lungs/Pleura: Post left lobectomy with surgical clips at the hilum. Motion artifact limits detailed assessment. Small pleural effusions, right greater than left with associated basilar atelectasis. Septal thickening and ground-glass opacity in the right lung, likely pulmonary edema. Upper Abdomen: No acute abnormality. Musculoskeletal: Exaggerated upper thoracic lordosis. Ossification of the anterior longitudinal ligament. There is a defect in the ossification at T7-T8 with widening of the anterior disc space, new from prior chest CT 12/14/2009. Oblique fracture of the superior T8 vertebral body. Minimal adjacent soft tissue density. Similar findings are  seen in the cervical spine would for previously characterized with dedicated cervical spine imaging. There is cortical buckling of the mid sternal body that may be an acute fracture. No evidence of acute rib fracture. Postsurgical change in the left hemithorax. Review of the MIP images confirms the above findings. IMPRESSION: 1. No pulmonary embolus. 2. Small bilateral pleural effusions, right greater than left. Probable pulmonary edema involving the right greater than left lung. 3. Acute extension injury at T7-T8 through the T7-T8 disc space and oblique fracture through T8 vertebral body. 4. Cortical buckling of the sternum may reflect acute fracture. 5. Coronary artery calcifications.  Thoracic aortic atherosclerosis. These results will be called to the ordering clinician or representative by the Radiologist Assistant, and communication documented in the PACS or zVision Dashboard. 1. Electronically Signed   By: Jeb Levering M.D.   On: 08-09-2016 02:35   Ct Cervical Spine Wo Contrast  Result Date: 08/05/2016 CLINICAL DATA:  Fall.  Code stroke.  Altered level of consciousness. EXAM: CT HEAD WITHOUT CONTRAST CT CERVICAL SPINE WITHOUT CONTRAST TECHNIQUE: Multidetector CT imaging of the head and cervical spine was performed following the standard protocol without intravenous contrast. Multiplanar CT image reconstructions of the cervical spine were also generated. COMPARISON:  None. FINDINGS: CT HEAD FINDINGS Brain: Moderate atrophy. Negative for acute or chronic infarction. Negative for acute hemorrhage or mass. No shift of the midline structures. Vascular: No hyperdense vessel or unexpected calcification. Skull: Right parietal soft tissue swelling in the scalp. Negative for skull fracture. Sinuses/Orbits: Mild mucosal edema paranasal sinuses.  Normal orbit. Other: None CT CERVICAL SPINE FINDINGS Alignment: Mild retrolisthesis C5-6.  Remaining alignment normal. Skull base and vertebrae: Extensive ossification  of the anterior longitudinal ligament throughout the cervical and upper thoracic spine. There is a break in the ossification at the C5-6 disc space which is well-defined and could be acute. MRI may be helpful determine if there is soft tissue or bone marrow edema in this area. No fractures in the posterior elements. No vertebral body fracture. Soft tissues and spinal canal: Negative for soft tissue mass. Extensive calcification in the carotid arteries bilaterally. Disc levels: Mild cervical spine disc degeneration. No significant spinal stenosis. Upper chest: Negative Other: None IMPRESSION: No acute intracranial abnormality. Negative for skull fracture. 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 There is a break in this ossification at the C5-6 disc space which could be due to acute fracture. MRI may be helpful for further evaluation to evaluate for ligamentous injury  or occult fracture. No other fracture identified in the cervical spine. These results were called by telephone at the time of interpretation on 08/20/2016 at 1:42 pm to Dr. Orlie Dakin, and Dr. Leonel Ramsay, who verbally acknowledged these results. Electronically Signed   By: Franchot Gallo M.D.   On: 07/31/2016 13:43   Mr Cervical Spine Wo Contrast  Result Date: 07/31/2016 CLINICAL DATA:  Personal history of congestive heart failure and lung cancer. Acute presentation after falling in a restaurant today. EXAM: MRI CERVICAL SPINE WITHOUT CONTRAST TECHNIQUE: Multiplanar, multisequence MR imaging of the cervical spine was performed. No intravenous contrast was administered. COMPARISON:  Cervical spine CT 07/30/2016. Chest CT 12/14/2009 which barely includes the C5-6 level anteriorly. Chest CT from 12/13/2008 which better includes the C5-6 level anteriorly. FINDINGS: Alignment: 2 mm retrolisthesis C5-6. Vertebrae: Hyperextension injury with  acute fracture of a previously intact anterior osteophyte at C5-6. Mild edema in the disc space. Mild prevertebral edema. No other acute or traumatic finding. Marrow space abnormality within the T1 vertebral body that I think is most consistent with hemangioma. CT findings are fairly subtle. Cord: No cord compression or cord injury. Posterior Fossa, vertebral arteries, paraspinal tissues: Negative Disc levels: Ankylosis of the spine from C3 through the upper thoracic region with the described anterior hyper extension fracture at C5-6. IMPRESSION: MR confirms acute hyperextension injury with fracture of an anterior bridging osteophyte at the C5-6 level. Water signal in the disc space. Mild prevertebral edema. 2 mm retrolisthesis. No canal compromise or cord injury. Additional evidence for this being an acute fracture is that the anterior osteophyte was solid on previous chest CTs. Likely benign hemangioma within the T1 vertebral body. Findings on CT are fairly subtle. T1 hyperintensity affecting most of the lesion makes this unlikely to be metastatic tumor. Electronically Signed   By: Nelson Chimes M.D.   On: 08/12/2016 16:57   Portable Chest Xray  Result Date: 08-05-2016 CLINICAL DATA:  Acute respiratory failure with hypoxia. EXAM: PORTABLE CHEST 1 VIEW COMPARISON:  Yesterday at 1516 hour, as well as 12/30/2015 FINDINGS: Endotracheal tube at the thoracic inlet 5.4 cm from the carina. Cardiomegaly is unchanged. There is decreased pulmonary edema from prior exam. Decreased left pleural effusion. Left lung scarring again seen. No new focal airspace disease. No pneumothorax. Surgical clips project over the left hilum. IMPRESSION: 1. Endotracheal tube 5.4 cm from the carina. 2. Decreased pulmonary edema and left pleural effusion from prior exam, improving CHF. Stable cardiomegaly. Electronically Signed   By: Jeb Levering M.D.   On: 2016/08/05 01:01   Dg Chest Port 1 View  Result Date: 08/26/2016 CLINICAL DATA:   Stroke. EXAM: PORTABLE CHEST 1 VIEW COMPARISON:  12/30/2015. FINDINGS: Surgical clips left chest. Cardiomegaly with diffuse bilateral pulmonary interstitial prominence consistent with congestive heart failure with pulmonary interstitial edema. Small left pleural effusion. No pneumothorax. IMPRESSION: Congestive heart failure with bilateral pulmonary interstitial edema and small left pleural effusion. Electronically Signed   By: Marcello Moores  Register   On: 08/16/2016 15:40   Ct Head Code Stroke W/o Cm  Result Date: 08/22/2016 CLINICAL DATA:  Fall.  Code stroke.  Altered level of consciousness. EXAM: CT HEAD WITHOUT CONTRAST CT CERVICAL SPINE WITHOUT CONTRAST TECHNIQUE: Multidetector CT imaging of the head and cervical spine was performed following the standard protocol without intravenous contrast. Multiplanar CT image reconstructions of the cervical spine were also generated. COMPARISON:  None. FINDINGS: CT HEAD FINDINGS Brain: Moderate atrophy. Negative for acute or chronic infarction. Negative for acute hemorrhage  or mass. No shift of the midline structures. Vascular: No hyperdense vessel or unexpected calcification. Skull: Right parietal soft tissue swelling in the scalp. Negative for skull fracture. Sinuses/Orbits: Mild mucosal edema paranasal sinuses.  Normal orbit. Other: None CT CERVICAL SPINE FINDINGS Alignment: Mild retrolisthesis C5-6.  Remaining alignment normal. 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 There is a break in the ossification at the C5-6 disc space which is well-defined and could be acute. MRI may be helpful determine if there is soft tissue or bone marrow edema in this area. No fractures in the posterior elements. No vertebral body fracture. Soft tissues and spinal canal: Negative for soft tissue mass. Extensive calcification in the carotid arteries bilaterally. Disc levels: Mild cervical spine  disc degeneration. No significant spinal stenosis. Upper chest: Negative Other: None IMPRESSION: No acute intracranial abnormality. Negative for skull fracture. 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 There is a break in this ossification at the C5-6 disc space which could be due to acute fracture. MRI may be helpful for further evaluation to evaluate for ligamentous injury or occult fracture. No other fracture identified in the cervical spine. These results were called by telephone at the time of interpretation on 08/25/2016 at 1:42 pm to Dr. Orlie Dakin, and Dr. Leonel Ramsay, who verbally acknowledged these results. Electronically Signed   By: Franchot Gallo M.D.   On: 08/23/2016 13:43    ROS: As above Blood pressure 116/71, pulse (!) 102, temperature 98.5 F (36.9 C), temperature source Oral, resp. rate (!) 24, height _0  (1.88 m), weight 104.5 kg (230 lb 6.1 oz), SpO2 100 %. Physical Exam  General: An alert and pleasant 83 year old white male in no apparent distress  HEENT: Normocephalic, extraocular muscles intact,  Neck: There is no obvious deformities. He is wearing a Aspen collar.  Thorax: Symmetric  Abdomen: Obese and soft  Extremities: Unremarkable  Neurologic exam: The patient is alert and oriented 3. Glasgow Coma Scale 15. Cranial nerves II through XII were examined bilaterally and grossly normal except for some presbycusis. The patient's motor strength is 5 over 5 in his bilateral biceps, triceps, handgrip, gastrocnemius, dorsiflexors. Sensory function is intact to light touch sensation all tested dermatomes of all 4 extremities. Cerebellar function is intact to rapid alternating movements extremities bilaterally.  I have reviewed the patient's cervical CT and MRIs scan performed yesterday at [facility]. He has findings consistent with diffuse idiopathic  skeletal hyperostosis. He has a fracture through the osteophyte at C5-6 without significant subluxation.  Also reviewed the patient's CT angiogram performed at [facility] only has it pertains to the spine. It demonstrates he has a fracture through the T7-8 osteophyte and a fracture into the T8 vertebral body which is not definitely displace   Assessment/Plan: Diffuse idiopathic skeletal hyperostosis, C5-6 and T7-8 fracture: I have discussed situation with the patient and his wife. These fractures will likely heal in a TLSO with a cervical extension. I have ordered this orthosis. I doubt he would need surgery and he is not a very good surgical candidate. I have answered all their questions.  Jerelene Salaam D 2016-08-24, 10:41 AM

## 2016-08-27 NOTE — Consult Note (Signed)
PULMONARY / CRITICAL CARE MEDICINE   Name: [name] MRN: [id number] DOB: 16-Dec-1927    ADMISSION DATE:  07/31/2016 CONSULTATION DATE:  08/01/2016  REFERRING MD:  Tat  CHIEF COMPLAINT:  Cardiac arrest  HISTORY OF PRESENT ILLNESS:   83 y/o male with a history of cardiomyopathy and aortic stenosis was admitted on 08/16/2016 after syncope. He was intubated on my evaluation and was unable to provide history so history was obtained by chart review. He was going to a restaurant on the evening of 08/23/2016 when he felt dizzy and fell back and struck his head. He came to the emergency department where he was found to have a C5-6 osteophyte fracture. He was admitted to the hospital for further evaluation and it was felt that he had syncope in the setting of acute systolic heart failure exacerbation. He was noted to be 240 pounds on his baseline is 230 pounds. He was given IV Lasix, placed on telemetry and sent to the telemetry floor. This evening he became bradycardic and unresponsive. The code team was called CPR was performed for 4 minutes epinephrine was administered during that time. He had return of spontaneous circulation and then he was intubated he was following commands on the ventilator. He was transferred to the intensive care unit for further monitoring.  PAST MEDICAL HISTORY :  He  has a past medical history of Aortic stenosis; Cancer ([place]); Cardiomyopathy ([place]) (03/2016); and Syncope.  PAST SURGICAL HISTORY: He  has a past surgical history that includes Lung removal, partial (Left) and Hemorroidectomy.  No Known Allergies  No current facility-administered medications on file prior to encounter.    No current outpatient prescriptions on file prior to encounter.    FAMILY HISTORY:  His has no family status information on file.    SOCIAL HISTORY: He  reports that he quit smoking about 30 years ago. His smoking use included Cigarettes. He smoked 2.00 packs per day. He has  never used smokeless tobacco. He reports that he drinks alcohol. He reports that he does not use drugs.  REVIEW OF SYSTEMS:   Cannot obtain  SUBJECTIVE:  As above  VITAL SIGNS: BP 111/62 (BP Location: Left Arm)   Pulse 86   Temp 97.6 F (36.4 C) (Oral)   Resp (!) 22   Ht 6\' 2"  (1.88 m)   Wt 235 lb 10.8 oz (106.9 kg)   SpO2 100%   BMI 30.26 kg/m   HEMODYNAMICS:    VENTILATOR SETTINGS: Vent Mode: PRVC FiO2 (%):  [60 %] 60 % Set Rate:  [14 bmp] 14 bmp Vt Set:  [id number]] 660 mL PEEP:  [5 cmH20] 5 cmH20  INTAKE / OUTPUT: No intake/output data recorded.  PHYSICAL EXAMINATION: General:  On vent, mildly agitated Neuro:  Awake, alert, follows commands HEENT:  Normocephalic atraumatic, hard collar in place, endotracheal tube in place Cardiovascular:  Regular rate and rhythm with notable systolic murmur Lungs:  Clear to auscultation with ventilator supported breaths Abdomen:  Bowel sounds positive, nontender nondistended Musculoskeletal:  Normal bulk and tone Skin:  No rash or skin breakdown  LABS:  BMET  Recent Labs Lab 08/24/2016 1308 07/27/2016 1315  NA 137 139  K 3.6 3.6  CL 104 105  CO2 20*  --   BUN 16 21*  CREATININE 1.15 1.00  GLUCOSE 120* 116*    Electrolytes  Recent Labs Lab 08/18/2016 1308  CALCIUM 9.1    CBC  Recent Labs Lab 07/27/2016 1308 08/08/2016 1315  WBC 8.7  --  HGB 14.6 15.3  HCT 43.7 45.0  PLT 142*  --     Coag's  Recent Labs Lab 08/17/2016 1308  APTT 34  INR 1.29    Sepsis Markers No results for input(s): LATICACIDVEN, PROCALCITON, O2SATVEN in the last 168 hours.  ABG No results for input(s): PHART, PCO2ART, PO2ART in the last 168 hours.  Liver Enzymes  Recent Labs Lab 08/14/2016 1308  AST 30  ALT 21  ALKPHOS 54  BILITOT 0.9  ALBUMIN 4.1    Cardiac Enzymes No results for input(s): TROPONINI, PROBNP in the last 168 hours.  Glucose  Recent Labs Lab 08/23/2016 1331 08/16/16 0009  GLUCAP 121* 200*     Imaging Ct Cervical Spine Wo Contrast  Result Date: 08/07/2016 CLINICAL DATA:  Fall.  Code stroke.  Altered level of consciousness. EXAM: CT HEAD WITHOUT CONTRAST CT CERVICAL SPINE WITHOUT CONTRAST TECHNIQUE: Multidetector CT imaging of the head and cervical spine was performed following the standard protocol without intravenous contrast. Multiplanar CT image reconstructions of the cervical spine were also generated. COMPARISON:  None. FINDINGS: CT HEAD FINDINGS Brain: Moderate atrophy. Negative for acute or chronic infarction. Negative for acute hemorrhage or mass. No shift of the midline structures. Vascular: No hyperdense vessel or unexpected calcification. Skull: Right parietal soft tissue swelling in the scalp. Negative for skull fracture. Sinuses/Orbits: Mild mucosal edema paranasal sinuses.  Normal orbit. Other: None CT CERVICAL SPINE FINDINGS Alignment: Mild retrolisthesis C5-6.  Remaining alignment normal. 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 There is a break in the ossification at the C5-6 disc space which is well-defined and could be acute. MRI may be helpful determine if there is soft tissue or bone marrow edema in this area. No fractures in the posterior elements. No vertebral body fracture. Soft tissues and spinal canal: Negative for soft tissue mass. Extensive calcification in the carotid arteries bilaterally. Disc levels: Mild cervical spine disc degeneration. No significant spinal stenosis. Upper chest: Negative Other: None IMPRESSION: No acute intracranial abnormality. Negative for skull fracture. 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 There is a break in this ossification at the C5-6 disc space which could be due to acute fracture. MRI may be helpful for further evaluation to  evaluate for ligamentous injury or occult fracture. No other fracture identified in the cervical spine. These results were called by telephone at the time of interpretation on 08/09/2016 at 1:42 pm to Dr. Orlie Dakin, and Dr. Leonel Ramsay, who verbally acknowledged these results. Electronically Signed   By: Franchot Gallo M.D.   On: 08/14/2016 13:43   Mr Cervical Spine Wo Contrast  Result Date: 07/29/2016 CLINICAL DATA:  Personal history of congestive heart failure and lung cancer. Acute presentation after falling in a restaurant today. EXAM: MRI CERVICAL SPINE WITHOUT CONTRAST TECHNIQUE: Multiplanar, multisequence MR imaging of the cervical spine was performed. No intravenous contrast was administered. COMPARISON:  Cervical spine CT 07/27/2016. Chest CT 12/14/2009 which barely includes the C5-6 level anteriorly. Chest CT from 12/13/2008 which better includes the C5-6 level anteriorly. FINDINGS: Alignment: 2 mm retrolisthesis C5-6. Vertebrae: Hyperextension injury with acute fracture of a previously intact anterior osteophyte at C5-6. Mild edema in the disc space. Mild prevertebral edema. No other acute or traumatic finding. Marrow space abnormality within the T1 vertebral body that I think is most consistent with hemangioma. CT findings are fairly subtle. Cord: No  cord compression or cord injury. Posterior Fossa, vertebral arteries, paraspinal tissues: Negative Disc levels: Ankylosis of the spine from C3 through the upper thoracic region with the described anterior hyper extension fracture at C5-6. IMPRESSION: MR confirms acute hyperextension injury with fracture of an anterior bridging osteophyte at the C5-6 level. Water signal in the disc space. Mild prevertebral edema. 2 mm retrolisthesis. No canal compromise or cord injury. Additional evidence for this being an acute fracture is that the anterior osteophyte was solid on previous chest CTs. Likely benign hemangioma within the T1 vertebral body. Findings on  CT are fairly subtle. T1 hyperintensity affecting most of the lesion makes this unlikely to be metastatic tumor. Electronically Signed   By: Nelson Chimes M.D.   On: 07/29/2016 16:57   Dg Chest Port 1 View  Result Date: 08/23/2016 CLINICAL DATA:  Stroke. EXAM: PORTABLE CHEST 1 VIEW COMPARISON:  12/30/2015. FINDINGS: Surgical clips left chest. Cardiomegaly with diffuse bilateral pulmonary interstitial prominence consistent with congestive heart failure with pulmonary interstitial edema. Small left pleural effusion. No pneumothorax. IMPRESSION: Congestive heart failure with bilateral pulmonary interstitial edema and small left pleural effusion. Electronically Signed   By: Marcello Moores  Register   On: 08/09/2016 15:40   Ct Head Code Stroke W/o Cm  Result Date: 08/08/2016 CLINICAL DATA:  Fall.  Code stroke.  Altered level of consciousness. EXAM: CT HEAD WITHOUT CONTRAST CT CERVICAL SPINE WITHOUT CONTRAST TECHNIQUE: Multidetector CT imaging of the head and cervical spine was performed following the standard protocol without intravenous contrast. Multiplanar CT image reconstructions of the cervical spine were also generated. COMPARISON:  None. FINDINGS: CT HEAD FINDINGS Brain: Moderate atrophy. Negative for acute or chronic infarction. Negative for acute hemorrhage or mass. No shift of the midline structures. Vascular: No hyperdense vessel or unexpected calcification. Skull: Right parietal soft tissue swelling in the scalp. Negative for skull fracture. Sinuses/Orbits: Mild mucosal edema paranasal sinuses.  Normal orbit. Other: None CT CERVICAL SPINE FINDINGS Alignment: Mild retrolisthesis C5-6.  Remaining alignment normal. 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 There is a break in the ossification at the C5-6 disc space which is well-defined and could be acute. MRI may be helpful determine if there is soft tissue or bone  marrow edema in this area. No fractures in the posterior elements. No vertebral body fracture. Soft tissues and spinal canal: Negative for soft tissue mass. Extensive calcification in the carotid arteries bilaterally. Disc levels: Mild cervical spine disc degeneration. No significant spinal stenosis. Upper chest: Negative Other: None IMPRESSION: No acute intracranial abnormality. Negative for skull fracture. 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 There is a break in this ossification at the C5-6 disc space which could be due to acute fracture. MRI may be helpful for further evaluation to evaluate for ligamentous injury or occult fracture. No other fracture identified in the cervical spine. These results were called by telephone at the time of interpretation on 08/17/2016 at 1:42 pm to Dr. Orlie Dakin, and Dr. Leonel Ramsay, who verbally acknowledged these results. Electronically Signed   By: Franchot Gallo M.D.   On: 07/31/2016 13:43     STUDIES:  August 20, 2016 echocardiogram pending  >  08-20-2016 CT angiogram >   CULTURES:   ANTIBIOTICS:   SIGNIFICANT EVENTS: 08/15/2016 admission for CHF exacerbation followed by cardiac arrest in the setting of bradycardia  LINES/TUBES: 08/03/2016 endotracheal tube  DISCUSSION: This is an 83 year old gentleman who had syncope and a fall at home today which led to a C5 osteophyte fracture and unfortunately after his admission to the telemetry floor he developed a cardiac arrest this evening. Cardiac arrest occurred in the setting of bradycardia. The etiology is uncertain, differential diagnosis includes RV strain in the setting of chronic pulmonary hypertension (secondary to long-standing systolic dysfunction?, Chronic thromboembolic disease? Other World Health Organization group 3 disease) versus conduction abnormality. Differential diagnosis also  includes PE though this seems less likely considering the bradycardia.  ASSESSMENT / PLAN:  PULMONARY A: Acute respiratory failure with hypoxemia P:   Continue full ventilatory support for now, suspect we will be able to extubate the morning Check ABG Check chest x-ray Ventilator associated pneumonia prevention protocol  CARDIOVASCULAR A:  Cardiac arrest in setting of bradycardia Bradycardia now resolved Aortic stenosis, moderate Cardiomyopathy LVEF 45-50% Pulmonary hypertension August 2017 echocardiogram RVSP 57 mmHg P:  Telemetry monitoring Check CT angiogram to evaluate for pulmonary embolism Cardiology consult, echocardiogram Troponin Check CBC  RENAL A:   No acute issues P:   Monitor BMET and UOP Replace electrolytes as needed   GASTROINTESTINAL A:   No acute issues P:   Place OG if to remain intubated  HEMATOLOGIC A:   No acute issues P:  Monitor for bleeding Lovenox for DVT prevention  INFECTIOUS A:   No acute issues P:   Monitor for fever  ENDOCRINE A:   No acute issues   P:   Monitor glucose  NEUROLOGIC A:   Cardiac arrest, but no evidence of anoxic injury C5 osteophyte fracture, seen by neurosurgery (Dr. Arnoldo Morale) in the emergency department he recommended conservative management with a hard collar P:   RASS goal: -1 Intermittent sedation protocol with fentanyl and Versed   FAMILY  - Updates: Called wife, advised she not come in (bad weather) until AM, updated her on his condition  - Inter-disciplinary family meet or Palliative Care meeting due by:  day 7   My cc time 45 minutes  Roselie Awkward, MD [facility] Pager: [phone] Cell: [phone] After 3pm or if no response, call [phone]  Aug 06, 2016, 12:35 AM

## 2016-08-27 NOTE — Progress Notes (Signed)
Patient transported to CT Scan and returned to unit without any complication.

## 2016-08-27 NOTE — Progress Notes (Signed)
Cloth tape removed. [place] replaced no complications noted.

## 2016-08-27 NOTE — Progress Notes (Signed)
Patient extubated per MD's order. 

## 2016-08-27 NOTE — Progress Notes (Signed)
At 1754 pts heart monitor alarmed for asystole. At this time no heart tones asculated; no pulse noted and monior reading asystole. This was verified with second RN [place].  [facility] called. Pt is NOT suitable for organs, tissues or eyes. Referral number is [id number].

## 2016-08-27 NOTE — Consult Note (Addendum)
CARDIOLOGY CONSULT NOTE   Referring Physician:  Primary Physician: Primary Cardiologist: Reason for Consultation:    HPI:  The patient is an 83 yo male who presents originally after having a syncopal episode while walking into a restaurant earlier today.  Most of the HPI is obtained from the chart as the patient is intubated and sedated.  The fall today resulted in trauma to his head.  He was originally admitted to the hospitalist service, however, tonight he developed bradycardia resulting in a loss of pulse.  Chest compressions were performed and epi was given.  This lasted for approximately 5 minutes.  He was intubated for airway protection and sent to the MICU.  On review of his monitor he seems to slowly develop sinus bradycardia followed by junctional escape beats.  He then appears to regain his sinus beats, however, he has interment high grade AV nodal block.  On my exam his rhythm is stable.  In looking back at the chart it appears he's had multiple episodes of this.  ECG shows either sinus tachy, or normal sinus rhythm  Review of Systems:     Unable to perform secondary to the patient being intubated and sedated  Past Medical History:  Diagnosis Date  . Aortic stenosis    moderate on 03/2016 TTE  . Cancer ([place])    Lung  . Cardiomyopathy ([place]) 03/2016   LVEF 45-50%  . Syncope     No prescriptions prior to admission.     . enoxaparin (LOVENOX) injection  40 mg Subcutaneous Q24H  . famotidine (PEPCID) IV  20 mg Intravenous Q12H  . furosemide  40 mg Intravenous BID  . sodium chloride flush  3 mL Intravenous Q12H    Infusions:   No Known Allergies  Social History   Social History  . Marital status: Married    Spouse name: N/A  . Number of children: N/A  . Years of education: N/A   Occupational History  . Not on file.   Social History Main Topics  . Smoking status: Former Smoker    Packs/day: 2.00    Types: Cigarettes    Quit date: 08/27/1985  . Smokeless  tobacco: Never Used  . Alcohol use Yes     Comment: occ.  . Drug use: No  . Sexual activity: Not on file   Other Topics Concern  . Not on file   Social History Narrative  . No narrative on file    No family history on file.  PHYSICAL EXAM: Vitals:   07/27/2016 1945 08/23/2016 2153  BP: 117/72 111/62  Pulse: 86 86  Resp: (!) 29 (!) 22  Temp:  97.6 F (36.4 C)     Intake/Output Summary (Last 24 hours) at 2016/08/30 0241 Last data filed at 08/15/2016 2200  Gross per 24 hour  Intake              240 ml  Output              300 ml  Net              -60 ml    General: in a C collar, Intubated HEENT: signs of previous head trauma from the fall Neck: supple. no JVD. Carotids 2+ bilat; no bruits. No lymphadenopathy or thryomegaly appreciated. Cor: PMI nondisplaced. Regular rate & rhythm. No rubs or gallops,  2/6 systolic murmurs. Lungs: clear Abdomen: soft, nontender, nondistended. No hepatosplenomegaly. No bruits or masses. Good bowel sounds. Extremities: no cyanosis, clubbing,  rash, edema Neuro: alert & oriented x 3, cranial nerves grossly intact. moves all 4 extremities w/o difficulty. Affect pleasant.  ECG:  Results for orders placed or performed during the hospital encounter of 08/09/2016 (from the past 24 hour(s))  Protime-INR     Status: Abnormal   Collection Time: 07/28/2016  1:08 PM  Result Value Ref Range   Prothrombin Time 16.2 (H) 11.4 - 15.2 seconds   INR 1.29   APTT     Status: None   Collection Time: 08/20/2016  1:08 PM  Result Value Ref Range   aPTT 34 24 - 36 seconds  CBC     Status: Abnormal   Collection Time: 08/25/2016  1:08 PM  Result Value Ref Range   WBC 8.7 4.0 - 10.5 K/uL   RBC 4.92 4.22 - 5.81 MIL/uL   Hemoglobin 14.6 13.0 - 17.0 g/dL   HCT 43.7 39.0 - 52.0 %   MCV 88.8 78.0 - 100.0 fL   MCH 29.7 26.0 - 34.0 pg   MCHC 33.4 30.0 - 36.0 g/dL   RDW 15.6 (H) 11.5 - 15.5 %   Platelets 142 (L) 150 - 400 K/uL  Differential     Status: None   Collection  Time: 07/28/2016  1:08 PM  Result Value Ref Range   Neutrophils Relative % 65 %   Neutro Abs 5.6 1.7 - 7.7 K/uL   Lymphocytes Relative 25 %   Lymphs Abs 2.1 0.7 - 4.0 K/uL   Monocytes Relative 9 %   Monocytes Absolute 0.8 0.1 - 1.0 K/uL   Eosinophils Relative 1 %   Eosinophils Absolute 0.1 0.0 - 0.7 K/uL   Basophils Relative 0 %   Basophils Absolute 0.0 0.0 - 0.1 K/uL  Comprehensive metabolic panel     Status: Abnormal   Collection Time: 08/02/2016  1:08 PM  Result Value Ref Range   Sodium 137 135 - 145 mmol/L   Potassium 3.6 3.5 - 5.1 mmol/L   Chloride 104 101 - 111 mmol/L   CO2 20 (L) 22 - 32 mmol/L   Glucose, Bld 120 (H) 65 - 99 mg/dL   BUN 16 6 - 20 mg/dL   Creatinine, Ser 1.15 0.61 - 1.24 mg/dL   Calcium 9.1 8.9 - 10.3 mg/dL   Total Protein 7.2 6.5 - 8.1 g/dL   Albumin 4.1 3.5 - 5.0 g/dL   AST 30 15 - 41 U/L   ALT 21 17 - 63 U/L   Alkaline Phosphatase 54 38 - 126 U/L   Total Bilirubin 0.9 0.3 - 1.2 mg/dL   GFR calc non Af Amer 56 (L) >60 mL/min   GFR calc Af Amer >60 >60 mL/min   Anion gap 13 5 - 15  I-stat troponin, ED     Status: None   Collection Time: 08/17/2016  1:14 PM  Result Value Ref Range   Troponin i, poc 0.01 0.00 - 0.08 ng/mL   Comment 3          I-Stat Chem 8, ED     Status: Abnormal   Collection Time: 08/09/2016  1:15 PM  Result Value Ref Range   Sodium 139 135 - 145 mmol/L   Potassium 3.6 3.5 - 5.1 mmol/L   Chloride 105 101 - 111 mmol/L   BUN 21 (H) 6 - 20 mg/dL   Creatinine, Ser 1.00 0.61 - 1.24 mg/dL   Glucose, Bld 116 (H) 65 - 99 mg/dL   Calcium, Ion 1.09 (L) 1.15 - 1.40 mmol/L  TCO2 23 0 - 100 mmol/L   Hemoglobin 15.3 13.0 - 17.0 g/dL   HCT 45.0 39.0 - 52.0 %  CBG monitoring, ED     Status: Abnormal   Collection Time: 07/28/2016  1:31 PM  Result Value Ref Range   Glucose-Capillary 121 (H) 65 - 99 mg/dL  Brain natriuretic peptide     Status: Abnormal   Collection Time: 08/13/2016  9:04 PM  Result Value Ref Range   B Natriuretic Peptide 1,724.3 (H)  0.0 - 100.0 pg/mL  TSH     Status: None   Collection Time: 07/31/2016  9:04 PM  Result Value Ref Range   TSH 3.071 0.350 - 4.500 uIU/mL  Glucose, capillary     Status: Abnormal   Collection Time: 08-30-16 12:09 AM  Result Value Ref Range   Glucose-Capillary 200 (H) 65 - 99 mg/dL   Comment 1 Notify RN    Comment 2 Document in Chart   Basic metabolic panel     Status: Abnormal   Collection Time: 08/30/16 12:15 AM  Result Value Ref Range   Sodium 136 135 - 145 mmol/L   Potassium 3.8 3.5 - 5.1 mmol/L   Chloride 103 101 - 111 mmol/L   CO2 17 (L) 22 - 32 mmol/L   Glucose, Bld 215 (H) 65 - 99 mg/dL   BUN 20 6 - 20 mg/dL   Creatinine, Ser 1.55 (H) 0.61 - 1.24 mg/dL   Calcium 8.8 (L) 8.9 - 10.3 mg/dL   GFR calc non Af Amer 39 (L) >60 mL/min   GFR calc Af Amer 45 (L) >60 mL/min   Anion gap 16 (H) 5 - 15  CBC     Status: Abnormal   Collection Time: 08-30-2016 12:15 AM  Result Value Ref Range   WBC 15.4 (H) 4.0 - 10.5 K/uL   RBC 5.03 4.22 - 5.81 MIL/uL   Hemoglobin 14.9 13.0 - 17.0 g/dL   HCT 45.2 39.0 - 52.0 %   MCV 89.9 78.0 - 100.0 fL   MCH 29.6 26.0 - 34.0 pg   MCHC 33.0 30.0 - 36.0 g/dL   RDW 15.6 (H) 11.5 - 15.5 %   Platelets 159 150 - 400 K/uL  Troponin I     Status: Abnormal   Collection Time: 30-Aug-2016 12:41 AM  Result Value Ref Range   Troponin I 10.67 (HH) <0.03 ng/mL   Ct Angio Chest Pe W Or Wo Contrast  Result Date: Aug 30, 2016 CLINICAL DATA:  Syncope. EXAM: CT ANGIOGRAPHY CHEST WITH CONTRAST TECHNIQUE: Multidetector CT imaging of the chest was performed using the standard protocol during bolus administration of intravenous contrast. Multiplanar CT image reconstructions and MIPs were obtained to evaluate the vascular anatomy. CONTRAST:  100 cc Isovue 370 IV COMPARISON:  Radiograph earlier this day. FINDINGS: Cardiovascular: There are no filling defects within the pulmonary arteries to suggest pulmonary embolus. Atherosclerosis of thoracic aorta without aneurysm or evidence of  dissection. There is multi chamber cardiomegaly. Coronary artery calcifications. No pericardial effusion. Mediastinum/Nodes: No mediastinal or hilar adenopathy. No axillary adenopathy. Endotracheal tube at the thoracic inlet. The esophagus is decompressed. Lungs/Pleura: Post left lobectomy with surgical clips at the hilum. Motion artifact limits detailed assessment. Small pleural effusions, right greater than left with associated basilar atelectasis. Septal thickening and ground-glass opacity in the right lung, likely pulmonary edema. Upper Abdomen: No acute abnormality. Musculoskeletal: Exaggerated upper thoracic lordosis. Ossification of the anterior longitudinal ligament. There is a defect in the ossification at T7-T8 with widening of the  anterior disc space, new from prior chest CT 12/14/2009. Oblique fracture of the superior T8 vertebral body. Minimal adjacent soft tissue density. Similar findings are seen in the cervical spine would for previously characterized with dedicated cervical spine imaging. There is cortical buckling of the mid sternal body that may be an acute fracture. No evidence of acute rib fracture. Postsurgical change in the left hemithorax. Review of the MIP images confirms the above findings. IMPRESSION: 1. No pulmonary embolus. 2. Small bilateral pleural effusions, right greater than left. Probable pulmonary edema involving the right greater than left lung. 3. Acute extension injury at T7-T8 through the T7-T8 disc space and oblique fracture through T8 vertebral body. 4. Cortical buckling of the sternum may reflect acute fracture. 5. Coronary artery calcifications.  Thoracic aortic atherosclerosis. These results will be called to the ordering clinician or representative by the Radiologist Assistant, and communication documented in the PACS or zVision Dashboard. 1. Electronically Signed   By: Jeb Levering M.D.   On: August 14, 2016 02:35   Ct Cervical Spine Wo Contrast  Result Date:  08/14/2016 CLINICAL DATA:  Fall.  Code stroke.  Altered level of consciousness. EXAM: CT HEAD WITHOUT CONTRAST CT CERVICAL SPINE WITHOUT CONTRAST TECHNIQUE: Multidetector CT imaging of the head and cervical spine was performed following the standard protocol without intravenous contrast. Multiplanar CT image reconstructions of the cervical spine were also generated. COMPARISON:  None. FINDINGS: CT HEAD FINDINGS Brain: Moderate atrophy. Negative for acute or chronic infarction. Negative for acute hemorrhage or mass. No shift of the midline structures. Vascular: No hyperdense vessel or unexpected calcification. Skull: Right parietal soft tissue swelling in the scalp. Negative for skull fracture. Sinuses/Orbits: Mild mucosal edema paranasal sinuses.  Normal orbit. Other: None CT CERVICAL SPINE FINDINGS Alignment: Mild retrolisthesis C5-6.  Remaining alignment normal. 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 There is a break in the ossification at the C5-6 disc space which is well-defined and could be acute. MRI may be helpful determine if there is soft tissue or bone marrow edema in this area. No fractures in the posterior elements. No vertebral body fracture. Soft tissues and spinal canal: Negative for soft tissue mass. Extensive calcification in the carotid arteries bilaterally. Disc levels: Mild cervical spine disc degeneration. No significant spinal stenosis. Upper chest: Negative Other: None IMPRESSION: No acute intracranial abnormality. Negative for skull fracture. 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 There is a break in this ossification at the C5-6 disc space which could be due to acute fracture. MRI may be helpful for further evaluation to evaluate for ligamentous injury or occult fracture. No other  fracture identified in the cervical spine. These results were called by telephone at the time of interpretation on 07/28/2016 at 1:42 pm to Dr. Orlie Dakin, and Dr. Leonel Ramsay, who verbally acknowledged these results. Electronically Signed   By: Franchot Gallo M.D.   On: 08/18/2016 13:43   Mr Cervical Spine Wo Contrast  Result Date: 08/06/2016 CLINICAL DATA:  Personal history of congestive heart failure and lung cancer. Acute presentation after falling in a restaurant today. EXAM: MRI CERVICAL SPINE WITHOUT CONTRAST TECHNIQUE: Multiplanar, multisequence MR imaging of the cervical spine was performed. No intravenous contrast was administered. COMPARISON:  Cervical spine CT 08/24/2016. Chest CT 12/14/2009 which barely includes the C5-6 level anteriorly. Chest CT from 12/13/2008 which better includes the C5-6 level anteriorly.  FINDINGS: Alignment: 2 mm retrolisthesis C5-6. Vertebrae: Hyperextension injury with acute fracture of a previously intact anterior osteophyte at C5-6. Mild edema in the disc space. Mild prevertebral edema. No other acute or traumatic finding. Marrow space abnormality within the T1 vertebral body that I think is most consistent with hemangioma. CT findings are fairly subtle. Cord: No cord compression or cord injury. Posterior Fossa, vertebral arteries, paraspinal tissues: Negative Disc levels: Ankylosis of the spine from C3 through the upper thoracic region with the described anterior hyper extension fracture at C5-6. IMPRESSION: MR confirms acute hyperextension injury with fracture of an anterior bridging osteophyte at the C5-6 level. Water signal in the disc space. Mild prevertebral edema. 2 mm retrolisthesis. No canal compromise or cord injury. Additional evidence for this being an acute fracture is that the anterior osteophyte was solid on previous chest CTs. Likely benign hemangioma within the T1 vertebral body. Findings on CT are fairly subtle. T1 hyperintensity affecting most of the  lesion makes this unlikely to be metastatic tumor. Electronically Signed   By: Nelson Chimes M.D.   On: 08/18/2016 16:57   Portable Chest Xray  Result Date: 10-Aug-2016 CLINICAL DATA:  Acute respiratory failure with hypoxia. EXAM: PORTABLE CHEST 1 VIEW COMPARISON:  Yesterday at 1516 hour, as well as 12/30/2015 FINDINGS: Endotracheal tube at the thoracic inlet 5.4 cm from the carina. Cardiomegaly is unchanged. There is decreased pulmonary edema from prior exam. Decreased left pleural effusion. Left lung scarring again seen. No new focal airspace disease. No pneumothorax. Surgical clips project over the left hilum. IMPRESSION: 1. Endotracheal tube 5.4 cm from the carina. 2. Decreased pulmonary edema and left pleural effusion from prior exam, improving CHF. Stable cardiomegaly. Electronically Signed   By: Jeb Levering M.D.   On: Aug 10, 2016 01:01   Dg Chest Port 1 View  Result Date: 08/16/2016 CLINICAL DATA:  Stroke. EXAM: PORTABLE CHEST 1 VIEW COMPARISON:  12/30/2015. FINDINGS: Surgical clips left chest. Cardiomegaly with diffuse bilateral pulmonary interstitial prominence consistent with congestive heart failure with pulmonary interstitial edema. Small left pleural effusion. No pneumothorax. IMPRESSION: Congestive heart failure with bilateral pulmonary interstitial edema and small left pleural effusion. Electronically Signed   By: Marcello Moores  Register   On: 08/20/2016 15:40   Ct Head Code Stroke W/o Cm  Result Date: 07/27/2016 CLINICAL DATA:  Fall.  Code stroke.  Altered level of consciousness. EXAM: CT HEAD WITHOUT CONTRAST CT CERVICAL SPINE WITHOUT CONTRAST TECHNIQUE: Multidetector CT imaging of the head and cervical spine was performed following the standard protocol without intravenous contrast. Multiplanar CT image reconstructions of the cervical spine were also generated. COMPARISON:  None. FINDINGS: CT HEAD FINDINGS Brain: Moderate atrophy. Negative for acute or chronic infarction. Negative for acute  hemorrhage or mass. No shift of the midline structures. Vascular: No hyperdense vessel or unexpected calcification. Skull: Right parietal soft tissue swelling in the scalp. Negative for skull fracture. Sinuses/Orbits: Mild mucosal edema paranasal sinuses.  Normal orbit. Other: None CT CERVICAL SPINE FINDINGS Alignment: Mild retrolisthesis C5-6.  Remaining alignment normal. 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 There is a break in the ossification at the C5-6 disc space which is well-defined and could be acute. MRI may be helpful determine if there is soft tissue or bone marrow edema in this area. No fractures in the posterior elements. No vertebral body fracture. Soft tissues and spinal canal: Negative for soft tissue mass. Extensive calcification in the carotid arteries bilaterally. Disc  levels: Mild cervical spine disc degeneration. No significant spinal stenosis. Upper chest: Negative Other: None IMPRESSION: No acute intracranial abnormality. Negative for skull fracture. 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 There is a break in this ossification at the C5-6 disc space which could be due to acute fracture. MRI may be helpful for further evaluation to evaluate for ligamentous injury or occult fracture. No other fracture identified in the cervical spine. These results were called by telephone at the time of interpretation on 08/01/2016 at 1:42 pm to Dr. Orlie Dakin, and Dr. Leonel Ramsay, who verbally acknowledged these results. Electronically Signed   By: Franchot Gallo M.D.   On: 08/09/2016 13:43     ASSESSMENT/PLAN  Syncope   Assessment:  On his initial rhythm strips it looks like vagal stimulation resulting in bradycardia and junctional escape rhythm, however, on later strips there appears to be  a P wave marching through, which doesn't have a QRS associated with it.  He is likely having intermittent periods of complete heart block resulting in his syncope.  Elevated troponin, possibly due to CPR earlier.  Though he was groggy from sedation, he shook his head no regarding chest pain or dyspnea.  Would continue to trend the troponins until they start to come down.  Would also start heparin though this is likely type II Mi.     Plan  -  If he continues to have these episodes, may need a temp wire and possible pacemaker implant on Monday  -  Can try dopamine should his rates get slow resulting in hypotension  -  Avoid AV nodal blocking agents  -  ECHO pending  Baruch Merl, MD, PhD Cardiology

## 2016-08-27 NOTE — Code Documentation (Signed)
  Patient Name: NATAN COOLE   MRN: [id number]   Date of Birth/ Sex: 1928-03-27 , male      Admission Date: 08/13/2016  Attending Provider: Juanito Doom, MD  Primary Diagnosis: Concussion with loss of consciousness <= 30 min, initial encounter   Indication: Pt was in his usual state of health until this PM, when he was noted to be bradycardic to rate 30s and pulseless . Code blue was subsequently called. At the time of arrival on scene, ACLS protocol was underway.   Technical Description:  - CPR performance duration:  5 minutes  - Was defibrillation or cardioversion used? No   - Was external pacer placed? No  - Was patient intubated pre/post CPR? Yes   Medications Administered: Y = Yes; Blank = No Amiodarone    Atropine    Calcium    Epinephrine  Y  Lidocaine    Magnesium    Norepinephrine    Phenylephrine    Sodium bicarbonate    Vasopressin     Post CPR evaluation:  - Final Status - Was patient successfully resuscitated ? Yes - What is current rhythm? Sinus tach  - What is current hemodynamic status? Hemodynamically stable   Miscellaneous Information:  - Labs sent, including: BMET CBC   - Primary team notified?  Yes  - Family Notified? No  - Additional notes/ transfer status: Transferred to ICU      Ledell Noss, MD  August 11, 2016, 12:01 AM

## 2016-08-27 NOTE — Progress Notes (Signed)
PULMONARY / CRITICAL CARE MEDICINE   Name: [name] MRN: [id number] DOB: 1927/11/25    ADMISSION DATE:  08/06/2016 CONSULTATION DATE:  08/11/2016  REFERRING MD:  Tat  CHIEF COMPLAINT:  Cardiac arrest  HISTORY OF PRESENT ILLNESS:   83 y/o male with a history of cardiomyopathy and aortic stenosis was admitted on 08/20/2016 after syncope. He was intubated on my evaluation and was unable to provide history so history was obtained by chart review. He was going to a restaurant on the evening of 08/23/2016 when he felt dizzy and fell back and struck his head. He came to the emergency department where he was found to have a C5-6 osteophyte fracture. He was admitted to the hospital for further evaluation and it was felt that he had syncope in the setting of acute systolic heart failure exacerbation. He was noted to be 240 pounds on his baseline is 230 pounds. He was given IV Lasix, placed on telemetry and sent to the telemetry floor. This evening he became bradycardic and unresponsive. The code team was called CPR was performed for 4 minutes epinephrine was administered during that time. He had return of spontaneous circulation and then he was intubated he was following commands on the ventilator. He was transferred to the intensive care unit for further monitoring.  SUBJECTIVE: Patient self-extubated this morning. Disoriented to time, president & place. Reports he is having some dyspnea.  REVIEW OF SYSTEMS:  Unable to obtain due to altered mental status.   VITAL SIGNS: BP 116/71   Pulse (!) 102   Temp 98.5 F (36.9 C) (Oral)   Resp (!) 24   Ht 6\' 2"  (1.88 m)   Wt 230 lb 6.1 oz (104.5 kg)   SpO2 100%   BMI 29.58 kg/m   HEMODYNAMICS:    VENTILATOR SETTINGS: Vent Mode: CPAP;PSV FiO2 (%):  [40 %-60 %] 40 % Set Rate:  [14 bmp] 14 bmp Vt Set:  [id number] mL] 660 mL PEEP:  [5 cmH20] 5 cmH20 Pressure Support:  [5 cmH20] 5 cmH20 Plateau Pressure:  [19 cmH20] 19 cmH20  INTAKE / OUTPUT: I/O  last 3 completed shifts: In: 290 [P.O.:240; IV Piggyback:50] Out: 300 [Urine:300]  PHYSICAL EXAMINATION: General:  No distress. Awake. No family at bedside. Neuro:  CN grossly in tact. Moving all 4 extremities. Not oriented to place, president, or year. Following commands. HEENT:  Cervical collar in place. No scleral icterus or injection. Cardiovascular:  Regular rate. No appreciable JVD with cervical collar in place.  Pulmonary: Refer upper airway breath sounds. Normal work of breathing on nasal cannula oxygen. Good phonation. Abdomen:  Soft. Protuberant. Nontender. Normal bowel sounds.  Skin:  Warm and dry. No rash on exposed skin.   LABS:  BMET  Recent Labs Lab 08/18/2016 1308 08/12/2016 1315 Aug 07, 2016 0015  NA 137 139 136  K 3.6 3.6 3.8  CL 104 105 103  CO2 20*  --  17*  BUN 16 21* 20  CREATININE 1.15 1.00 1.55*  GLUCOSE 120* 116* 215*    Electrolytes  Recent Labs Lab 08/18/2016 1308 08-07-16 0015  CALCIUM 9.1 8.8*    CBC  Recent Labs Lab 08/14/2016 1308 08/17/2016 1315 Aug 07, 2016 0015  WBC 8.7  --  15.4*  HGB 14.6 15.3 14.9  HCT 43.7 45.0 45.2  PLT 142*  --  159    Coag's  Recent Labs Lab 08/03/16 1308  APTT 34  INR 1.29    Sepsis Markers No results for input(s): LATICACIDVEN, PROCALCITON, O2SATVEN in  the last 168 hours.  ABG No results for input(s): PHART, PCO2ART, PO2ART in the last 168 hours.  Liver Enzymes  Recent Labs Lab 08/11/2016 1308  AST 30  ALT 21  ALKPHOS 54  BILITOT 0.9  ALBUMIN 4.1    Cardiac Enzymes  Recent Labs Lab 08/09/16 0041  TROPONINI 10.67*    Glucose  Recent Labs Lab 08/23/2016 1331 08-09-16 0009  GLUCAP 121* 200*    Imaging Ct Angio Chest Pe W Or Wo Contrast  Result Date: 08-09-16 CLINICAL DATA:  Syncope. EXAM: CT ANGIOGRAPHY CHEST WITH CONTRAST TECHNIQUE: Multidetector CT imaging of the chest was performed using the standard protocol during bolus administration of intravenous contrast. Multiplanar CT  image reconstructions and MIPs were obtained to evaluate the vascular anatomy. CONTRAST:  100 cc Isovue 370 IV COMPARISON:  Radiograph earlier this day. FINDINGS: Cardiovascular: There are no filling defects within the pulmonary arteries to suggest pulmonary embolus. Atherosclerosis of thoracic aorta without aneurysm or evidence of dissection. There is multi chamber cardiomegaly. Coronary artery calcifications. No pericardial effusion. Mediastinum/Nodes: No mediastinal or hilar adenopathy. No axillary adenopathy. Endotracheal tube at the thoracic inlet. The esophagus is decompressed. Lungs/Pleura: Post left lobectomy with surgical clips at the hilum. Motion artifact limits detailed assessment. Small pleural effusions, right greater than left with associated basilar atelectasis. Septal thickening and ground-glass opacity in the right lung, likely pulmonary edema. Upper Abdomen: No acute abnormality. Musculoskeletal: Exaggerated upper thoracic lordosis. Ossification of the anterior longitudinal ligament. There is a defect in the ossification at T7-T8 with widening of the anterior disc space, new from prior chest CT 12/14/2009. Oblique fracture of the superior T8 vertebral body. Minimal adjacent soft tissue density. Similar findings are seen in the cervical spine would for previously characterized with dedicated cervical spine imaging. There is cortical buckling of the mid sternal body that may be an acute fracture. No evidence of acute rib fracture. Postsurgical change in the left hemithorax. Review of the MIP images confirms the above findings. IMPRESSION: 1. No pulmonary embolus. 2. Small bilateral pleural effusions, right greater than left. Probable pulmonary edema involving the right greater than left lung. 3. Acute extension injury at T7-T8 through the T7-T8 disc space and oblique fracture through T8 vertebral body. 4. Cortical buckling of the sternum may reflect acute fracture. 5. Coronary artery calcifications.   Thoracic aortic atherosclerosis. These results will be called to the ordering clinician or representative by the Radiologist Assistant, and communication documented in the PACS or zVision Dashboard. 1. Electronically Signed   By: Jeb Levering M.D.   On: 2016-08-09 02:35   Ct Cervical Spine Wo Contrast  Result Date: 07/29/2016 CLINICAL DATA:  Fall.  Code stroke.  Altered level of consciousness. EXAM: CT HEAD WITHOUT CONTRAST CT CERVICAL SPINE WITHOUT CONTRAST TECHNIQUE: Multidetector CT imaging of the head and cervical spine was performed following the standard protocol without intravenous contrast. Multiplanar CT image reconstructions of the cervical spine were also generated. COMPARISON:  None. FINDINGS: CT HEAD FINDINGS Brain: Moderate atrophy. Negative for acute or chronic infarction. Negative for acute hemorrhage or mass. No shift of the midline structures. Vascular: No hyperdense vessel or unexpected calcification. Skull: Right parietal soft tissue swelling in the scalp. Negative for skull fracture. Sinuses/Orbits: Mild mucosal edema paranasal sinuses.  Normal orbit. Other: None CT CERVICAL SPINE FINDINGS Alignment: Mild retrolisthesis C5-6.  Remaining alignment normal. 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 There is a break in the ossification  at the C5-6 disc space which is well-defined and could be acute. MRI may be helpful determine if there is soft tissue or bone marrow edema in this area. No fractures in the posterior elements. No vertebral body fracture. Soft tissues and spinal canal: Negative for soft tissue mass. Extensive calcification in the carotid arteries bilaterally. Disc levels: Mild cervical spine disc degeneration. No significant spinal stenosis. Upper chest: Negative Other: None IMPRESSION: No acute intracranial abnormality. Negative for skull fracture. 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 There is a break in this ossification at the C5-6 disc space which could be due to acute fracture. MRI may be helpful for further evaluation to evaluate for ligamentous injury or occult fracture. No other fracture identified in the cervical spine. These results were called by telephone at the time of interpretation on 08/02/2016 at 1:42 pm to Dr. Orlie Dakin, and Dr. Leonel Ramsay, who verbally acknowledged these results. Electronically Signed   By: Franchot Gallo M.D.   On: 07/30/2016 13:43   Mr Cervical Spine Wo Contrast  Result Date: 08/11/2016 CLINICAL DATA:  Personal history of congestive heart failure and lung cancer. Acute presentation after falling in a restaurant today. EXAM: MRI CERVICAL SPINE WITHOUT CONTRAST TECHNIQUE: Multiplanar, multisequence MR imaging of the cervical spine was performed. No intravenous contrast was administered. COMPARISON:  Cervical spine CT 08/24/2016. Chest CT 12/14/2009 which barely includes the C5-6 level anteriorly. Chest CT from 12/13/2008 which better includes the C5-6 level anteriorly. FINDINGS: Alignment: 2 mm retrolisthesis C5-6. Vertebrae: Hyperextension injury with acute fracture of a previously intact anterior osteophyte at C5-6. Mild edema in the disc space. Mild prevertebral edema. No other acute or traumatic finding. Marrow space abnormality within the T1 vertebral body that I think is most consistent with hemangioma. CT findings are fairly subtle. Cord: No cord compression or cord injury. Posterior Fossa, vertebral arteries, paraspinal tissues: Negative Disc levels: Ankylosis of the spine from C3 through the upper thoracic region with the described anterior hyper extension fracture at C5-6. IMPRESSION: MR confirms acute hyperextension injury with fracture of an anterior bridging osteophyte at the C5-6 level. Water signal in the disc space.  Mild prevertebral edema. 2 mm retrolisthesis. No canal compromise or cord injury. Additional evidence for this being an acute fracture is that the anterior osteophyte was solid on previous chest CTs. Likely benign hemangioma within the T1 vertebral body. Findings on CT are fairly subtle. T1 hyperintensity affecting most of the lesion makes this unlikely to be metastatic tumor. Electronically Signed   By: Nelson Chimes M.D.   On: 08/13/2016 16:57   Portable Chest Xray  Result Date: 2016/08/10 CLINICAL DATA:  Acute respiratory failure with hypoxia. EXAM: PORTABLE CHEST 1 VIEW COMPARISON:  Yesterday at 1516 hour, as well as 12/30/2015 FINDINGS: Endotracheal tube at the thoracic inlet 5.4 cm from the carina. Cardiomegaly is unchanged. There is decreased pulmonary edema from prior exam. Decreased left pleural effusion. Left lung scarring again seen. No new focal airspace disease. No pneumothorax. Surgical clips project over the left hilum. IMPRESSION: 1. Endotracheal tube 5.4 cm from the carina. 2. Decreased pulmonary edema and left pleural effusion from prior exam, improving CHF. Stable cardiomegaly. Electronically Signed   By: Jeb Levering M.D.   On: 08/10/16 01:01   Dg Chest Port 1 View  Result Date: 08/18/2016 CLINICAL DATA:  Stroke. EXAM: PORTABLE CHEST 1 VIEW COMPARISON:  12/30/2015. FINDINGS: Surgical clips left chest. Cardiomegaly  with diffuse bilateral pulmonary interstitial prominence consistent with congestive heart failure with pulmonary interstitial edema. Small left pleural effusion. No pneumothorax. IMPRESSION: Congestive heart failure with bilateral pulmonary interstitial edema and small left pleural effusion. Electronically Signed   By: Marcello Moores  Register   On: 08/14/2016 15:40   Ct Head Code Stroke W/o Cm  Result Date: 08/08/2016 CLINICAL DATA:  Fall.  Code stroke.  Altered level of consciousness. EXAM: CT HEAD WITHOUT CONTRAST CT CERVICAL SPINE WITHOUT CONTRAST TECHNIQUE: Multidetector  CT imaging of the head and cervical spine was performed following the standard protocol without intravenous contrast. Multiplanar CT image reconstructions of the cervical spine were also generated. COMPARISON:  None. FINDINGS: CT HEAD FINDINGS Brain: Moderate atrophy. Negative for acute or chronic infarction. Negative for acute hemorrhage or mass. No shift of the midline structures. Vascular: No hyperdense vessel or unexpected calcification. Skull: Right parietal soft tissue swelling in the scalp. Negative for skull fracture. Sinuses/Orbits: Mild mucosal edema paranasal sinuses.  Normal orbit. Other: None CT CERVICAL SPINE FINDINGS Alignment: Mild retrolisthesis C5-6.  Remaining alignment normal. 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 There is a break in the ossification at the C5-6 disc space which is well-defined and could be acute. MRI may be helpful determine if there is soft tissue or bone marrow edema in this area. No fractures in the posterior elements. No vertebral body fracture. Soft tissues and spinal canal: Negative for soft tissue mass. Extensive calcification in the carotid arteries bilaterally. Disc levels: Mild cervical spine disc degeneration. No significant spinal stenosis. Upper chest: Negative Other: None IMPRESSION: No acute intracranial abnormality. Negative for skull fracture. 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 There is a break in this ossification at the C5-6 disc space which could be due to acute fracture. MRI may be helpful for further evaluation to evaluate for ligamentous injury or occult fracture. No other fracture identified in the cervical spine. These results were called by telephone at the time of interpretation on 08/02/2016 at 1:42 pm to Dr. Orlie Dakin, and Dr.  Leonel Ramsay, who verbally acknowledged these results. Electronically Signed   By: Franchot Gallo M.D.   On: 08/03/2016 13:43     STUDIES:  CTA CHEST 12/9: IMPRESSION: 1. No pulmonary embolus. 2. Small bilateral pleural effusions, right greater than left. Probable pulmonary edema involving the right greater than left lung. 3. Acute extension injury at T7-T8 through the T7-T8 disc space and oblique fracture through T8 vertebral body. 4. Cortical buckling of the sternum may reflect acute fracture. 5. Coronary artery calcifications.  Thoracic aortic atherosclerosis.  TTE 12/9 >>  MICROBIOLOGY: MRSA PCR 12/9:  Negative   ANTIBIOTICS: None  SIGNIFICANT EVENTS: 12/08 - admission for CHF exacerbation followed by cardiac arrest in the setting of bradycardia 12/09 - self-extubated  LINES/TUBES: OETT 12/8 - 12/9 (self-extubated) PIV  ASSESSMENT / PLAN:  PULMONARY A: Acute Hypoxic Respiratory Failure - Self extubated 12/9 AM. H/O Lung Cancer - S/P LLL Lobectomy 10 years prior.  P:   Weaning FiO2 for Sat >90% Incentive Spirometry for pulmonary toilette  CARDIOVASCULAR A:  Cardiac Arrest - In setting of bradycardia. Bradycardia - Resolved. Moderate Aortic Stenosis Chronic Systolic CHF - EF [id number]. Pulmonary HTN - TTE August 2017 w/ RVSP 49mmHg.  P:  Continuous Telemetry monitoring Cardiology Consulted Vitals per Unit protocol Echocardiogram pending Lasix 40mg  IV BID  RENAL  A:   Acute Renal Failure - Mild. Likely due to ATN post arrest.  P:   Trending UOP Monitoring electrolytes & renal function daily Replacing electrolytes as indicated  GASTROINTESTINAL A:   No acute issues.  P:   NPO for now Pepcid IV q12hr  HEMATOLOGIC A:   Leukocytosis - Worsening likely due to stress response. Thrombocytopenia - Resolved. Mild.  P:  Trending cell counts daily w/ CBC D/C Lovenox Starting Heparin [place] q8hr SCDs  INFECTIOUS A:   No acute infection.  P:    Monitor for signs/symptoms of infection.  ENDOCRINE A:   Hyperglycemia - No h/o DM.   P:   Checking Hgb A1c SSI per Sensitive Algorithm Accu-Checks q4hr  NEUROLOGIC/MUSCULOSKELETAL A:   Acute Encephalopathy/Delirium - No evidence of anoxic injury. Likely due to sedation. C5 Osteophyte Fracture & Thoracic Vertebral Body Fractures - Noted on CTA. Seen by neurosurgery (Dr. Arnoldo Morale) in the emergency department he recommended conservative management with a hard collar.  P:   Fentanyl IV prn severe pain D/C Versed Close monitoring  FAMILY  - Updates:  Attempted to update Wife via phone regarding self-extubation 12/9 but no answer. No message left.   - Inter-disciplinary family meet or Palliative Care meeting due by:  12/16  TODAY'S SUMMARY:  84 y.o. gentleman who had syncope and a fall at home today which led to a C5 osteophyte fracture and unfortunately after his admission to the telemetry floor he developed a cardiac arrest this evening. Cardiac arrest occurred in the setting of bradycardia. The etiology is uncertain. Bradycardia has resolved. Patient self extubated this morning. Continue close monitoring. Contacting Dr. Arnoldo Morale for formal consult given findings on chest CTA.   I have spent an additional 36 minutes of critical care time today caring for the patient and reviewing the patient's electronic medical record.  Sonia Baller Ashok Cordia, M.D. [facility] Pulmonary & Critical Care Pager:  [phone] After 3pm or if no response, call [phone] 2016/08/07, 8:51 AM

## 2016-08-27 NOTE — Progress Notes (Signed)
CT results relayed to Dr Jimmy Footman / Warren Lacy

## 2016-08-27 NOTE — Progress Notes (Signed)
CRITICAL VALUE ALERT  Critical value received:  Troponin 10.67  Date of notification:  08/13/2016  Time of notification:  0133  Critical value read back:Yes.    Nurse who received alert:  Mary Beth hancock/ Marisue Brooklyn  MD notified (1st page):  Dr Jimmy Footman / Warren Lacy   Time of first page:  0145  MD notified (2nd page):  Time of second page:  Responding MD:  Dr Jimmy Footman / Warren Lacy  Time MD responded:  [phone]

## 2016-08-27 NOTE — Progress Notes (Signed)
PT Cancellation Note  Patient Details Name: RITTER SANDEFER MRN: [id number] DOB: 06/14/1928   Cancelled Treatment:    Reason Eval/Treat Not Completed: Medical issues which prohibited therapy. Patient self-extubated this am.  Will return at later time for PT evaluation.   Despina Pole 08/11/16, 9:57 AM Carita Pian. Sanjuana Kava, Alvin Pager [phone]

## 2016-08-27 NOTE — Progress Notes (Signed)
Patient attempted to get OOB, agitated.Five minutes later he became unresponsive, heart rate 40's. Dr. Ashok Cordia notified, RT came and bagged patient. See code sheet.  After several code attempts, patient made DNR per family request.

## 2016-08-27 NOTE — Progress Notes (Signed)
Nurse tech Laqueta Due helped me transfer pt from the chair to the bed. When pt reclined in the bed he became unresponsive with no pulse or breathing. Profound bradycardia on heart monitor. Began CPR and called code blue. Physician and rapid response notified; Epinephrine given; pulse detected and chest compressions stopped. Pt intubated and transferred to 47M bed 12.

## 2016-08-27 NOTE — Discharge Summary (Signed)
DEATH NOTE: For a complete accounting of the patient's history and physical exam on presentation please refer to the H&P dictated on 08/03/2016. Patient was an 83 year old male with known history of combined systolic and diastolic congestive heart failure as well as COPD. Patient presented with a week of worsening dyspnea as well as a syncopal episode. Patient was on Lasix as well as lisinopril as an outpatient for treatment of his underlying cardiomyopathy and aortic stenosis. Patient's dose of lisinopril had recently been decreased due to orthostasis and syncopal episodes thought to be caused by his antihypertensive regimen. Patient was getting out of cough to go into a restaurant when he began to feel dizzy. Patient subsequently fell backwards striking his head. EMS was called and transported the patient to hospital. Patient was given IV Lasix for presumed pulmonary edema in the emergency department MRI of the spine showed acute fracture of the anterior bridging osteophytes of C5-6 and neurosurgery was contacted. The patient was placed in an [place] collar further recommendation. Early in the morning on 12/9 the patient became bradycardic and unresponsive. CODE BLUE was initiated and patient was resuscitated for approximately 4 minutes with epinephrine and CPR. Patient did regain spontaneous circulation. He was endotracheally intubated during this event and transferred to the intensive care unit. At approximately 8:30 AM the patient was undergoing a pressure support wean and spontaneous breathing trial when he spontaneously self extubated. I evaluated the patient nearly immediately after self extubation and he was saturating normally nasal cannula oxygen. Patient did endorse some dyspnea but was disoriented to time, president, and place. Further sedation was held. Neurosurgery was contacted regarding the thoracic vertebral fractures noted on imaging which will likely secondary to the patient's resuscitation earlier.  At approximately 4 PM on 12/9 the patient attempted to get out of bed and was significantly agitated. He became bradycardic and subsequently unresponsive. The patient was aggressively resuscitated by staff after [place] was initiated. Emergent endotracheal intubation was performed by myself as well as emergent right femoral central venous catheter placement for IV access. After return of spontaneous circulation family was allowed to come to bedside. I updated the patient's 2 sons and wife at bedside regarding his tenuous status. Family all agreed that they did not wish to proceed with any further heroic measures or resuscitation. Within minutes the patient passed with his wife and children at bedside. The patient was noted to be in asystole at 5:54pm on 08-05-2016. There was no spontaneous respirations or palpable pulse. Heart sounds could not be auscultated. I offered my condolences.  DIAGNOSES AT DEATH: 1. Cardiogenic Shock 2. Symptomatic Bradycardia 3. Acute Hypoxic Respiratory Failure 4. Acute Renal Failure 5. Acute Encephalopathy 6. C5-6 Osteophyte and Thoracic Vertebral Body Fractures 7. Acute on Chronic Combined Systolic and Diastolic Congestive Heart Failure 8. Aortic Stenosis 9. History of Lung Cancer Post Lobectomy 10. History of COPD

## 2016-08-27 NOTE — Procedures (Signed)
Central Line Insertion Procedure Note  Consent:  Procedure performed emergently in the setting of cardiac arrest.  Laterality:  Right Femoral Vein  Description of Procedure: Patient was prepped and draped in sterile fashion.  I palpated the patients right femoral artery for landmark location.  The vein was compressible and nonpulsatile.  Finder needle was then inserted into the patient's vein and dark blood was aspirated.  The syringe was removed form the needle and blood flow was nonpulsatile.  The guidewire was then inserted through the needle into the patient's vein and needle was then removed. A skin nick was made with a sterile scalpel.  A dilator was passed over the guidewire into the subcutaneous tissue then removed.  The central venous catheter was then inserted over the guidewire into the vein.  The guidewire was then removed.  All ports were then aspirated and free of air before being flushed with sterile normal saline.  The ports were then clamped.  The catheter was sewen into place and a chlorhexidine bandage was applied.   Complications:  None.  Estimated Blood Loss:  Less than 10cc.  Condition Post Procedure:  Remains critically ill in the intensive care unit undergoing resuscitation.

## 2016-08-27 NOTE — Procedures (Signed)
Extubation Procedure Note  Patient Details:   Name: MANA REUTER DOB: 02-Feb-1928 MRN: [id number]   Airway Documentation:     Evaluation  O2 sats: stable throughout Complications: No apparent complications Patient did tolerate procedure well. Bilateral Breath Sounds: Clear   Yes  Pt self extubated at 0837, placed on 4L [place] SATS 100%. Pt has strong congested cough. RN at bedside.   Hope Pigeon, MA 08-26-16, 8:45 AM

## 2016-08-27 NOTE — Procedures (Signed)
Endotracheal Intubation:  Consent:  Procedure performed emergently during cardiac arrest.  Medications Administered: 1. Etomidate 40mg  IV push  Description of Procedure: During cardiac arrest patient was laying recumbent. Etomidate IV push was given. Using video laryngoscope with a Mac[employer]osh #4 blade I was able to visualize the vocal cords with ease. A 7.5 endotracheal tube was inserted between the vocal cords with ease. Endotracheal tube was secured at 26 cm at the teeth. Bilateral breath sounds were auscultated. Patient had symmetric chest wall rise. Repeat capnographic color change was appreciated.  Condition: Cardiac arrest with resuscitation ongoing at the end of intubation.

## 2016-08-27 NOTE — Code Documentation (Addendum)
PCCM Code Note: Called to bedside due to worsening acute hypoxia and altered mental status. Patient became progressively more bradycardic. Atropine IV push was administered. The patient's condition did  clinically continued to deteriorate with hypoxia and ultimately he went into ventricular fibrillation with suggestion of torsades on telemetry monitoring. Patient was resuscitated and ultimately transitioned to pulseless electrical activity and then back to ventricular fibrillation. Defibrillation was performed twice during resuscitation. Patient was given bolus bicarbonate as well as amiodarone and administered magnesium sulfate 2 g IV. Pulse was regained briefly before he again went into pulseless electrical activity and resuscitation continued. Femoral central venous catheter was placed for additional IV access and epinephrine infusion was begun after return of spontaneous circulation. The patient briefly regained a pulse allowing family to come to bedside before he passed. I was unable to auscultate any heart sounds and the patient had no spontaneous respirations. No pulse was palpable. I pronounced the patient deceased at 1730 p.m. Hospital chaplain was offered for Spiritual Support but family declined. Order was placed for extubation at family's request. I offered my condolences.  Sonia Baller Ashok Cordia, M.D. [facility] Pulmonary [facility] Pager:  [phone] After 3pm or if no response, call [phone] 5:32 PM 08-17-2016

## 2016-08-27 NOTE — Progress Notes (Signed)
RT NT suction patient without complications. Vital signs stable throughout. Patient tolerated fairly well. RN at bedside. RT will continue to monitor.

## 2016-08-27 NOTE — Progress Notes (Signed)
Orthopedic Tech Progress Note Patient Details:  [name] 10-20-1927 [id number]  Patient ID: [name], male   DOB: 06-20-1928, 83 y.o.   MRN: [id number]   [name] 09-01-16, 10:59 AM Called in bio-tech brace order; spoke with Fritz Pickerel

## 2016-08-27 DEATH — deceased

## 2017-08-27 IMAGING — CR DG CHEST 1V PORT
1 series · 1 of 1 positions shown · non-contrast
Comparison: Portable exam 9030 hours compared to 1157 hours

CLINICAL DATA: Acute respiratory failure with hypoxia, post cardiac
arrest, CPR, and central line placement, history lung cancer, non
ischemic cardiomyopathy, former smoker

EXAM:
PORTABLE CHEST 1 VIEW

[AP]
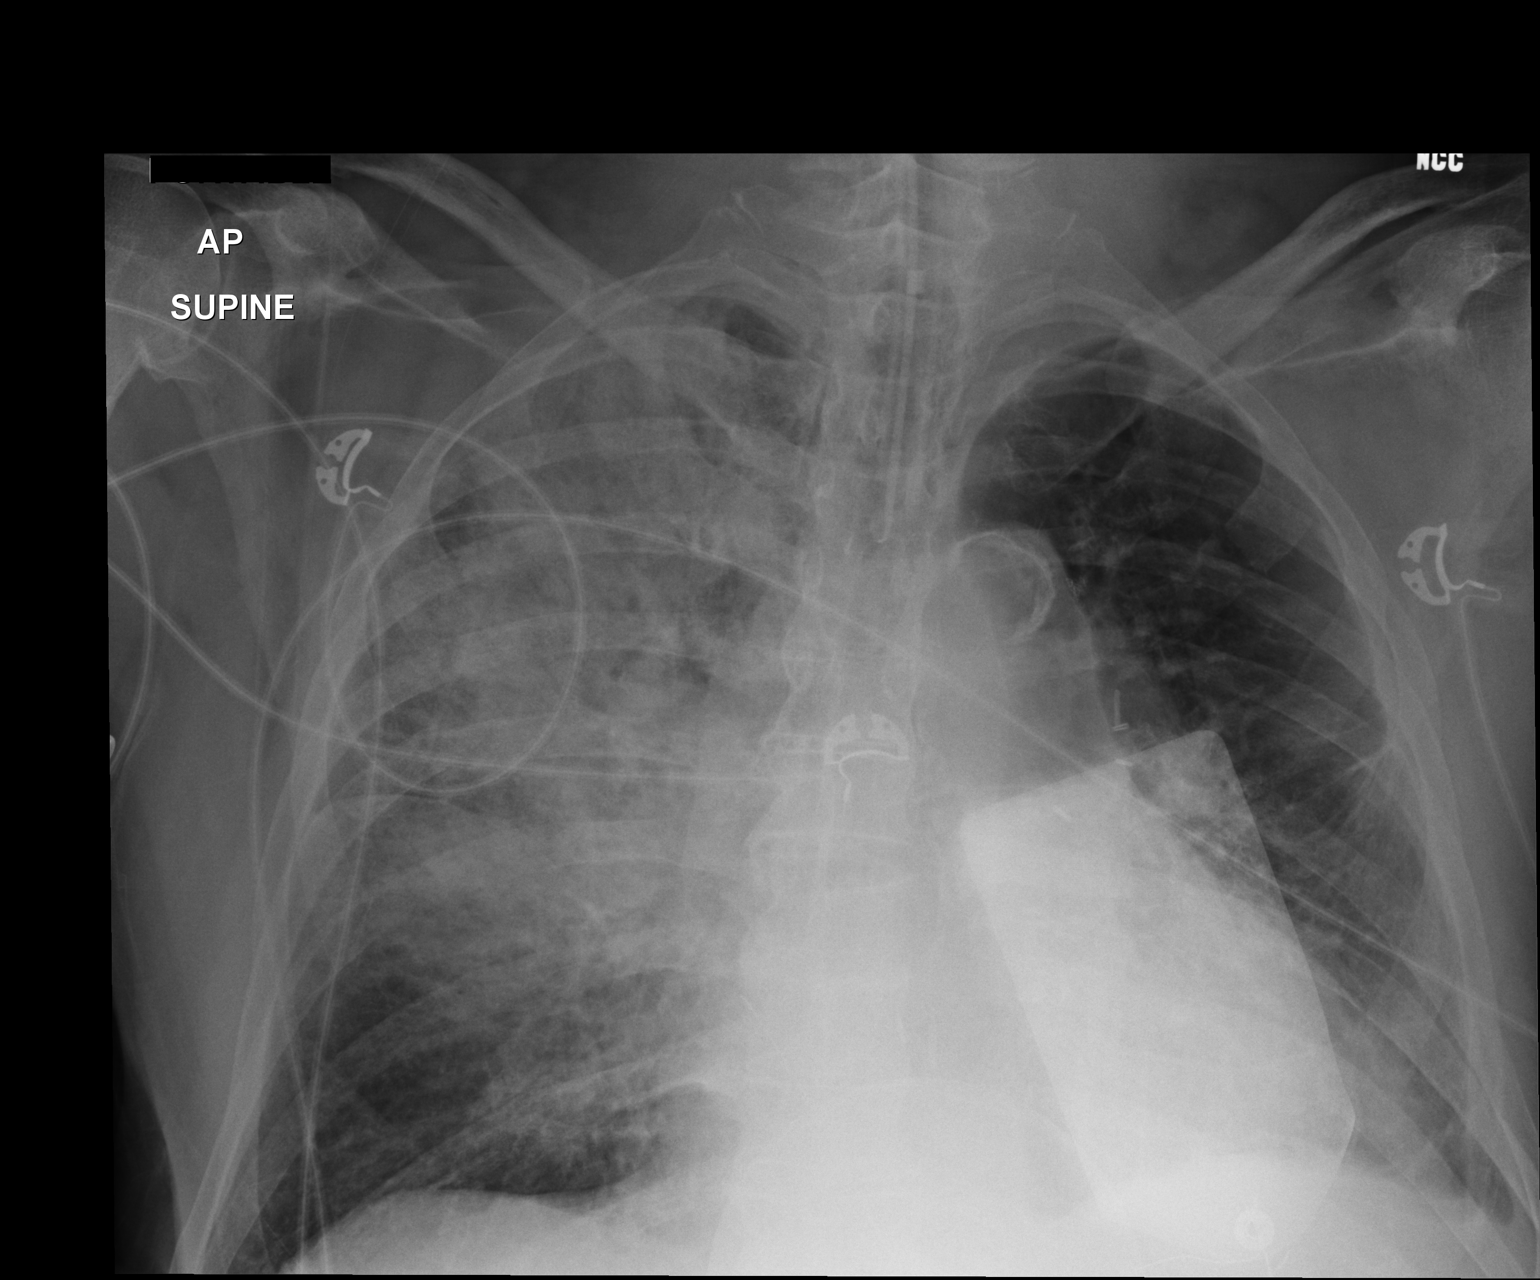

[1 of 1 positions shown; findings below may reference images not displayed]

FINDINGS: Tip of endotracheal tube projects 4.5 cm above carina.

No central venous catheter identified.

External pacing leads in EKG leads project over chest.

Stable heart size mediastinal contours.

Atherosclerotic calcification aorta.

Extensive infiltrate throughout RIGHT lung since earlier study
favoring pulmonary edema.

Minimal infiltrate LEFT lung.

No definite pneumothorax.

Bones diffusely demineralized.
IMPRESSION: New asymmetric pulmonary infiltrates much greater on the RIGHT
favoring pulmonary edema.
